# Patient Record
Sex: Male | Born: 1990 | Race: Black or African American | Hispanic: No | Marital: Single | Smoking: Former smoker
Health system: Southern US, Community
[De-identification: ages and names within clinical notes are randomized; demographics above are authoritative.]

## PROBLEM LIST (undated history)

## (undated) DIAGNOSIS — B20 Human immunodeficiency virus [HIV] disease: Secondary | ICD-10-CM

## (undated) DIAGNOSIS — Z21 Asymptomatic human immunodeficiency virus [HIV] infection status: Secondary | ICD-10-CM

## (undated) HISTORY — PX: FINGER SURGERY: SHX640

---
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
## 2011-01-12 ENCOUNTER — Inpatient Hospital Stay (INDEPENDENT_AMBULATORY_CARE_PROVIDER_SITE_OTHER)
Admission: RE | Admit: 2011-01-12 | Discharge: 2011-01-12 | Disposition: A | Discharge status: Home / Self Care | Payer: Medicaid Other | Source: Ambulatory Visit | Attending: Emergency Medicine | Admitting: Emergency Medicine

## 2011-01-12 DIAGNOSIS — R070 Pain in throat: Secondary | ICD-10-CM

## 2011-01-12 LAB — POCT RAPID STREP A (OFFICE): Streptococcus, Group A Screen (Direct): NEGATIVE

## 2011-03-16 LAB — GC/CHLAMYDIA PROBE AMP, GENITAL
Chlamydia, DNA Probe: NEGATIVE
GC Probe Amp, Genital: POSITIVE — AB

## 2012-06-04 ENCOUNTER — Encounter (HOSPITAL_COMMUNITY): Payer: Self-pay

## 2012-06-04 ENCOUNTER — Emergency Department (INDEPENDENT_AMBULATORY_CARE_PROVIDER_SITE_OTHER)
Admission: EM | Admit: 2012-06-04 | Discharge: 2012-06-04 | Disposition: A | Discharge status: Home / Self Care | Payer: Medicaid Other | Source: Home / Self Care | Attending: Emergency Medicine | Admitting: Emergency Medicine

## 2012-06-04 DIAGNOSIS — B86 Scabies: Secondary | ICD-10-CM

## 2012-06-04 MED ORDER — PERMETHRIN 5 % EX CREA: TOPICAL_CREAM | CUTANEOUS | Status: AC

## 2012-06-04 NOTE — Discharge Instructions (Signed)
Scabies is a rash caused by infestation with the mite Sarcoptes scabiei, a 0.3 mm mite that can burrow and deposit eggs in the the surface layer of the skin.  It is transmitted from other infected persons and often the entire family is infested even though they may not have symptoms.  The most common symptom is a very itchy rash.  Scabies can be treated by applying a cream with a medication call Permethrin.  An oral medication called Ivermectin is also available.  Here are the the instructions for treatment of scabies.  It is important to remember that all household members should be treated twice.  Once now, and once in 2 weeks.   On the first night, shower, then apply the Elimite cream from head to toe.  This means on the scalp, face, armpits, hands and wrists, under the breasts, the naval, the genital area, the cleft between the buttocks, the feet and between the toes--everywhere on the body.  Do not miss even 1 square inch.  Leave the cream on overnight, at least 8 hours.  The next morning, shower again and scrub the Elimite off completely.  Clip the nails short and scrub under the nails with a toothbrush, since the mites can often live under the nails, then be transmitted to other parts of the body.   After that, strip the bed and wash sheets, pillow cases, pajamas, underwear and anything that has come in contact with your body in the past month in hot water.    Spray your matresses, chairs, couch, and furniture with RID spray which can be gotten over the counter at the drug store.  Repeat this entire process in 1 week.  After the 2 applications, all the mites on your body should be dead.  It will take a while for the itching to go away, sometimes as much as a month.  The skin must rid itself of all the dead mites, eggs, and excreta.  You can use antihistamines such as Benadryl until this happens.  If the itching is severe, cortisone derivatives may help.  If the itching persists, it may be  that the rash is caused by something else, that your were reinfected or the Permethrin did not work in which case it would be reasonable to try the oral pill Ivermectin.  If you still have a rash or itching in 1 month, return to the Urgent Care Center or your primary care doctor for a recheck.   Scabies Scabies are small bugs (mites) that burrow under the skin and cause red bumps and severe itching. These bugs can only be seen with a microscope. Scabies are highly contagious. They can spread easily from person to person by direct contact. They are also spread through sharing clothing or linens that have the scabies mites living in them. It is not unusual for an entire family to become infected through shared towels, clothing, or bedding.  HOME CARE INSTRUCTIONS   Your caregiver may prescribe a cream or lotion to kill the mites. If this cream is prescribed; massage the cream into the entire area of the body from the neck to the bottom of both feet. Also massage the cream into the scalp and face if your child is less than 20 year old. Avoid the eyes and mouth.   Leave the cream on for 8 to12 hours. Do not wash your hands after application. Your child should bathe or shower after the 8 to 12 hour application period. Sometimes it is  helpful to apply the cream to your child at right before bedtime.   One treatment is usually effective and will eliminate approximately 95% of infestations. For severe cases, your caregiver may decide to repeat the treatment in 1 week. Everyone in your household should be treated with one application of the cream.   New rashes or burrows should not appear after successful treatment within 24 to 48 hours; however the itching and rash may last for 2 to 4 weeks after successful treatment. If your symptoms persist longer than this, see your caregiver.   Your caregiver also may prescribe a medication to help with the itching or to help the rash go away more quickly.   Scabies can  live on clothing or linens for up to 3 days. Your entire child's recently used clothing, towels, stuffed toys, and bed linens should be washed in hot water and then dried in a dryer for at least 20 minutes on high heat. Items that cannot be washed should be enclosed in a plastic bag for at least 3 days.   To help relieve itching, bathe your child in a cool bath or apply cool washcloths to the affected areas.   Your child may return to school after treatment with the prescribed cream.  SEEK MEDICAL CARE IF:   The itching persists longer than 4 weeks after treatment.   The rash spreads or becomes infected (the area has red blisters or yellow-tan crust).  Document Released: 11/22/2005 Document Revised: 11/11/2011 Document Reviewed: 04/02/2009 [facility] [address] Patient Information 2012 [place], [place].

## 2012-06-04 NOTE — ED Provider Notes (Signed)
Chief Complaint  Patient presents with  . Rash    History of Present Illness:   The patient has a two-week history of pruritic papules on his arms, fingers, wrists, and feet. He has no rash anywhere else. No one else he knows or lives with his had a similar rash. He denies any contactants, new foods, or new medications. He has no respiratory problems.  Review of Systems:  Other than noted above, the patient denies any of the following symptoms: Systemic:  No fever, chills, sweats, weight loss, or fatigue. ENT:  No nasal congestion, rhinorrhea, sore throat, swelling of lips, tongue or throat. Resp:  No cough, wheezing, or shortness of breath. Skin:  No rash, itching, nodules, or suspicious lesions.  PMFSH:  Past medical history, family history, social history, meds, and allergies were reviewed.  Physical Exam:   Vital signs:  BP 130/80  Pulse 86  Temp 99.7 F (37.6 C) (Oral)  Resp 17  SpO2 98% Gen:  Alert, oriented, in no distress. ENT:  Pharynx clear, no intraoral lesions, moist mucous membranes. Lungs:  Clear to auscultation. Skin:  He has tiny papules on his hands, fingers, web spaces between fingers, wrists, forearms, ankles, and feet. Skin was otherwise clear.    Assessment:  The encounter diagnosis was Scabies.  Plan:   1.  The following meds were prescribed:   New Prescriptions   PERMETHRIN (ELIMITE) 5 % CREAM    Apply head to toe at bedtime, including skin fold areas, hands, feet and between fingers and toes.  Leave on for at least 8 hours.  Scrub off next morning.  Repeat this same procedure in 1 week.   2.  The patient was instructed in symptomatic care and handouts were given. 3.  The patient was told to return if becoming worse in any way, if no better in 3 or 4 days, and given some red flag symptoms that would indicate earlier return.     Reuben Likes, MD 06/04/12 2030

## 2012-06-04 NOTE — ED Notes (Signed)
Pt has itchy rash on fingers and arms for two weeks.

## 2013-02-22 ENCOUNTER — Telehealth: Payer: Self-pay

## 2013-02-22 NOTE — Telephone Encounter (Signed)
Message left on voicemail regarding referral from [facility] .    Laurell Josephs, RN

## 2013-03-15 ENCOUNTER — Other Ambulatory Visit: Payer: Self-pay | Admitting: Internal Medicine

## 2013-03-15 ENCOUNTER — Ambulatory Visit: Payer: Self-pay

## 2013-03-15 ENCOUNTER — Ambulatory Visit: Payer: Medicaid Other

## 2013-03-15 DIAGNOSIS — B20 Human immunodeficiency virus [HIV] disease: Secondary | ICD-10-CM

## 2013-03-15 LAB — CBC WITH DIFFERENTIAL/PLATELET
Basophils Absolute: 0 10*3/uL (ref 0.0–0.1)
Eosinophils Relative: 2 % (ref 0–5)
Hemoglobin: 16.4 g/dL (ref 13.0–17.0)
Lymphocytes Relative: 51 % — ABNORMAL HIGH (ref 12–46)
Lymphs Abs: 1 10*3/uL (ref 0.7–4.0)
Neutrophils Relative %: 27 % — ABNORMAL LOW (ref 43–77)

## 2013-03-15 LAB — COMPLETE METABOLIC PANEL WITH GFR
AST: 21 U/L (ref 0–37)
Albumin: 4.5 g/dL (ref 3.5–5.2)
Alkaline Phosphatase: 64 U/L (ref 39–117)
GFR, Est African American: >89 mL/min
GFR, Est Non African American: >89 mL/min
Glucose, Bld: 79 mg/dL (ref 70–99)
Sodium: 137 mEq/L (ref 135–145)
Total Protein: 7.6 g/dL (ref 6.0–8.3)

## 2013-03-15 LAB — HEPATITIS B SURFACE ANTIGEN: Hepatitis B Surface Ag: NEGATIVE

## 2013-03-15 LAB — LIPID PANEL
Cholesterol: 175 mg/dL (ref 0–200)
HDL: 35 mg/dL — ABNORMAL LOW (ref >39)
Total CHOL/HDL Ratio: 5 Ratio

## 2013-03-15 LAB — HEPATITIS C ANTIBODY: HCV Ab: NEGATIVE

## 2013-03-16 ENCOUNTER — Telehealth: Payer: Self-pay | Admitting: *Deleted

## 2013-03-16 ENCOUNTER — Ambulatory Visit (INDEPENDENT_AMBULATORY_CARE_PROVIDER_SITE_OTHER): Payer: Self-pay | Admitting: *Deleted

## 2013-03-16 DIAGNOSIS — A539 Syphilis, unspecified: Secondary | ICD-10-CM

## 2013-03-16 LAB — T-HELPER CELL (CD4) - (RCID CLINIC ONLY): CD4 % Helper T Cell: 28 % — ABNORMAL LOW (ref 33–55)

## 2013-03-16 LAB — URINALYSIS
Glucose, UA: NEGATIVE mg/dL
Hgb urine dipstick: NEGATIVE
Ketones, ur: NEGATIVE mg/dL
Nitrite: NEGATIVE
Specific Gravity, Urine: 1.023 (ref 1.005–1.030)
Urobilinogen, UA: 1 mg/dL (ref 0.0–1.0)
pH: 7 (ref 5.0–8.0)

## 2013-03-16 LAB — T.PALLIDUM AB, TOTAL: T pallidum Antibodies (TP-PA): >8 S/CO — ABNORMAL HIGH (ref <0.90)

## 2013-03-16 LAB — HEPATITIS B SURFACE ANTIBODY,QUALITATIVE: Hep B S Ab: REACTIVE — AB

## 2013-03-16 LAB — HEPATITIS B CORE ANTIBODY, TOTAL: Hep B Core Total Ab: NEGATIVE

## 2013-03-16 MED ORDER — PENICILLIN G BENZATHINE 1200000 UNIT/2ML IM SUSP
1.2000 10*6.[IU] | Freq: Once | INTRAMUSCULAR | Status: AC
  Administered 2013-03-16: 1.2 10*6.[IU] via INTRAMUSCULAR

## 2013-03-16 NOTE — Telephone Encounter (Signed)
Patient notified, he will come in this afternoon for 1rst injection. Patrick Glass

## 2013-03-16 NOTE — Telephone Encounter (Signed)
Message copied by Macy Mis on Fri Mar 16, 2013 11:05 AM ------      Message from: Gardiner Barefoot      Created: Fri Mar 16, 2013  9:10 AM       This patient will need syphilis treatment with 3 weekly penicillin injections.  thanks ------

## 2013-03-17 LAB — HIV-1 RNA ULTRAQUANT REFLEX TO GENTYP+: HIV 1 RNA Quant: 63103 copies/mL — ABNORMAL HIGH (ref <20)

## 2013-03-19 NOTE — Progress Notes (Signed)
[facility] I will contact [facility] for records. Pt tested for HIV after finding his partner of 1 years medications.  He stated they practiced safe sex but condom broke on two occasions.   His partner is taking Stribild.   Laurell Josephs, RN

## 2013-03-22 ENCOUNTER — Ambulatory Visit (INDEPENDENT_AMBULATORY_CARE_PROVIDER_SITE_OTHER): Payer: Self-pay | Admitting: *Deleted

## 2013-03-22 ENCOUNTER — Ambulatory Visit: Payer: Self-pay

## 2013-03-22 DIAGNOSIS — A539 Syphilis, unspecified: Secondary | ICD-10-CM

## 2013-03-22 MED ORDER — PENICILLIN G BENZATHINE 1200000 UNIT/2ML IM SUSP
1.2000 10*6.[IU] | Freq: Once | INTRAMUSCULAR | Status: AC
  Administered 2013-03-22: 1.2 10*6.[IU] via INTRAMUSCULAR

## 2013-03-26 ENCOUNTER — Telehealth: Payer: Self-pay

## 2013-03-26 DIAGNOSIS — Z23 Encounter for immunization: Secondary | ICD-10-CM

## 2013-03-26 NOTE — Telephone Encounter (Signed)
Vaccines Updated  Patient will need TB skin test and pneumonia vaccine.

## 2013-03-29 ENCOUNTER — Ambulatory Visit (INDEPENDENT_AMBULATORY_CARE_PROVIDER_SITE_OTHER): Payer: Self-pay | Admitting: *Deleted

## 2013-03-29 DIAGNOSIS — A539 Syphilis, unspecified: Secondary | ICD-10-CM

## 2013-03-29 MED ORDER — PENICILLIN G BENZATHINE 1200000 UNIT/2ML IM SUSP
1.2000 10*6.[IU] | Freq: Once | INTRAMUSCULAR | Status: AC
Start: 2013-03-29 — End: 2013-03-29
  Administered 2013-03-29: 1.2 10*6.[IU] via INTRAMUSCULAR

## 2013-03-29 MED ORDER — PENICILLIN G BENZATHINE 1200000 UNIT/2ML IM SUSP
1.2000 10*6.[IU] | Freq: Once | INTRAMUSCULAR | Status: AC
  Administered 2013-03-29: 1.2 10*6.[IU] via INTRAMUSCULAR

## 2013-03-29 NOTE — Progress Notes (Signed)
Patient in for 3/3 bicillin treatments. Pt tolerated injections well.  Given condoms, educated about safe contact. Andree Coss, RN

## 2013-04-03 ENCOUNTER — Encounter: Payer: Self-pay | Admitting: Internal Medicine

## 2013-04-03 ENCOUNTER — Ambulatory Visit (INDEPENDENT_AMBULATORY_CARE_PROVIDER_SITE_OTHER): Payer: Medicaid Other | Admitting: Internal Medicine

## 2013-04-03 VITALS — BP 143/91 | HR 62 | Temp 98.3°F | Ht 67.0 in | Wt 125.0 lb

## 2013-04-03 DIAGNOSIS — B20 Human immunodeficiency virus [HIV] disease: Secondary | ICD-10-CM | POA: Insufficient documentation

## 2013-04-03 DIAGNOSIS — Z23 Encounter for immunization: Secondary | ICD-10-CM

## 2013-04-03 MED ORDER — ELVITEG-COBIC-EMTRICIT-TENOFDF 150-150-200-300 MG PO TABS: 1.0000 | ORAL_TABLET | Freq: Every day | ORAL | Status: DC

## 2013-04-03 NOTE — Progress Notes (Signed)
  Subjective:    Patient ID: Patrick Glass, male    DOB: 1991-02-07, 22 y.o.   MRN: [id number]  HPI He comes in as a new patient for evaluation of HIV. 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 Unknown to him, he was with an HIV positive partner who was taking Stribild. He used condoms however there were episodes where the condom would break. He does not recall any acute infection. He has no other medical problems. He was treated for syphilis by the [facility] department. No history of gonorrhea or chlamydia. He does have a history of genital herpes.  He does not have any questions regarding HIV. He does feel he is ready to take medications daily.   Review of Systems  Constitutional: Negative for fever, chills, activity change, fatigue and unexpected weight change.  HENT: Negative for sore throat and trouble swallowing.   Respiratory: Negative for cough and shortness of breath.   Cardiovascular: Negative for chest pain, palpitations and leg swelling.  Gastrointestinal: Negative for nausea, abdominal pain and diarrhea.  Musculoskeletal: Negative for myalgias, joint swelling and arthralgias.  Skin: Negative for rash.  Neurological: Negative for dizziness, light-headedness and headaches.       Objective:   Physical Exam  Constitutional: He appears well-developed and well-nourished. No distress.  HENT:  Mouth/Throat: Oropharynx is clear and moist. No oropharyngeal exudate.  Eyes: Right eye exhibits no discharge. Left eye exhibits no discharge. No scleral icterus.  Cardiovascular: Normal rate, regular rhythm and normal heart sounds.  Exam reveals no gallop and no friction rub.   No murmur heard. Pulmonary/Chest: Effort normal and breath sounds normal. No respiratory distress. He has no wheezes. He has no rales.  Abdominal: Soft. Bowel sounds are normal. He exhibits no distension. There is no tenderness.  There is no rebound.  Genitourinary: Penis normal.  Lymphadenopathy:    He has no cervical adenopathy.  Skin: Skin is warm and dry. No rash noted.          Assessment & Plan:

## 2013-04-03 NOTE — Progress Notes (Signed)
HPI: Patrick Glass is a 22 y.o. male presenting with new HIV diagnosis. He states he is ready to start treatment and appears to be a good candidate at this time.  Allergies: No Known Allergies  Vitals: Temp: 98.3 F (36.8 C) (04/29 1356) Temp src: Oral (04/29 1356) BP: 143/91 mmHg (04/29 1356) Pulse Rate: 62 (04/29 1356)  Past Medical History: No past medical history on file.  Social History: History   Social History  . Marital Status: Single    Spouse Name: N/A    Number of Children: N/A  . Years of Education: N/A   Social History Main Topics  . Smoking status: Never Smoker   . Smokeless tobacco: Never Used  . Alcohol Use: 80.0 oz/week    160 drink(s) per week     Comment: vodka  . Drug Use: 1.00 per week    Special: Marijuana  . Sexually Active: Yes -- Male partner(s)    Birth Control/ Protection: Condom     Comment: pt. declined condoms   Other Topics Concern  . None   Social History Narrative  . None   Current Regimen: Treatment naive  Labs: HIV 1 RNA Quant (copies/mL)  Date Value  03/15/2013 63103*     CD4 T Cell Abs (cmm)  Date Value  03/15/2013 320*     Hep B S Ab (no units)  Date Value  03/15/2013 REACTIVE*     Hepatitis B Surface Ag (no units)  Date Value  03/15/2013 NEGATIVE      HCV Ab (no units)  Date Value  03/15/2013 NEGATIVE     CrCl: Estimated Creatinine Clearance: 115.2 ml/min (by C-G formula based on Cr of 0.82).  Lipids:    Component Value Date/Time   CHOL 175 03/15/2013 1125   TRIG 108 03/15/2013 1125   HDL 35* 03/15/2013 1125   CHOLHDL 5.0 03/15/2013 1125   VLDL 22 03/15/2013 1125   LDLCALC 118* 03/15/2013 1125    Assessment: Patient appears likely to have good adherence. He does have some resistance at baseline to nevirapine and efavirenz. We discussed Stribild and importance of adherence to prevent resistance.   Recommendations: Start Stribild once daily  Drue Stager, PharmD [facility] for Infectious  D[facility] 04/03/2013, 3:20 PM

## 2013-04-03 NOTE — Assessment & Plan Note (Signed)
He was counseled on HIV. Appropriate vaccines offered and given. Counseling and medication options given by myself and the pharmacist. He will start Stribild. Occasion prescribed and will go through drug assistance program. I will have him return in about 3 weeks to continue counseling and make sure he is able to get his medications. He was told he can start his medications as soon as he gets it. He does feel he will take it daily and mechanism of resistance.

## 2013-04-18 ENCOUNTER — Other Ambulatory Visit: Payer: Self-pay | Admitting: *Deleted

## 2013-04-18 ENCOUNTER — Telehealth: Payer: Self-pay

## 2013-04-18 DIAGNOSIS — B20 Human immunodeficiency virus [HIV] disease: Secondary | ICD-10-CM

## 2013-04-18 MED ORDER — ELVITEG-COBIC-EMTRICIT-TENOFDF 150-150-200-300 MG PO TABS: 1.0000 | ORAL_TABLET | Freq: Every day | ORAL | Status: DC

## 2013-04-18 NOTE — Telephone Encounter (Signed)
Called to let patient know ADAP was approved and mailbox was full, could not leave a message

## 2013-04-19 ENCOUNTER — Ambulatory Visit (INDEPENDENT_AMBULATORY_CARE_PROVIDER_SITE_OTHER): Payer: Self-pay | Admitting: Internal Medicine

## 2013-04-19 ENCOUNTER — Encounter: Payer: Self-pay | Admitting: Internal Medicine

## 2013-04-19 VITALS — BP 126/83 | HR 64 | Temp 98.0°F | Ht 67.0 in | Wt 126.0 lb

## 2013-04-19 DIAGNOSIS — B20 Human immunodeficiency virus [HIV] disease: Secondary | ICD-10-CM

## 2013-04-19 NOTE — Assessment & Plan Note (Signed)
Going to start his medicine today. He will return in 4 weeks for labs and I will see him 2 weeks after his labs 2 over the results. The patient knows to call if he has any problems with his medications or side effects and not to stop without discussing with [place]. He knows he may have some fatigue as he starts his medicine.

## 2013-04-19 NOTE — Progress Notes (Signed)
  Subjective:    Patient ID: Patrick Glass, male    DOB: 16-Oct-1991, 21 y.o.   MRN: [id number]  HPI Comes in for followup of HIV. He was seen 2 weeks ago as a new patient after [facility] His CD4 count is 320 and his viral load is detectable with baseline mutation of 103 and and 98.  He completed his drug assistance paperwork and his prescription was sent to the pharmacy yesterday. He has no new questions today. He is going to go to the pharmacy today and pick up his medicines.   Review of Systems  Constitutional: Negative for fever, fatigue and unexpected weight change.  HENT: Negative for sore throat and trouble swallowing.   Gastrointestinal: Negative for nausea, abdominal pain and diarrhea.  Skin: Negative for rash.  Psychiatric/Behavioral: Negative for dysphoric mood. The patient is not nervous/anxious.        Objective:   Physical Exam  Constitutional: He appears well-developed and well-nourished. No distress.  Cardiovascular: Normal rate, regular rhythm and normal heart sounds.   No murmur heard.         Assessment & Plan:

## 2013-05-17 ENCOUNTER — Other Ambulatory Visit: Payer: Self-pay

## 2013-05-17 DIAGNOSIS — B20 Human immunodeficiency virus [HIV] disease: Secondary | ICD-10-CM

## 2013-05-17 LAB — COMPLETE METABOLIC PANEL WITH GFR
Albumin: 4.5 g/dL (ref 3.5–5.2)
Alkaline Phosphatase: 66 U/L (ref 39–117)
Calcium: 9.8 mg/dL (ref 8.4–10.5)
GFR, Est Non African American: 89 mL/min
Glucose, Bld: 85 mg/dL (ref 70–99)
Sodium: 138 mEq/L (ref 135–145)
Total Bilirubin: 0.5 mg/dL (ref 0.3–1.2)

## 2013-05-18 LAB — CBC WITH DIFFERENTIAL/PLATELET
Basophils Absolute: 0 10*3/uL (ref 0.0–0.1)
Basophils Relative: 0 % (ref 0–1)
Eosinophils Absolute: 0.1 10*3/uL (ref 0.0–0.7)
Eosinophils Relative: 5 % (ref 0–5)
HCT: 43.5 % (ref 39.0–52.0)
Lymphocytes Relative: 55 % — ABNORMAL HIGH (ref 12–46)
MCH: 33.8 pg (ref 26.0–34.0)
MCHC: 36.1 g/dL — ABNORMAL HIGH (ref 30.0–36.0)
MCV: 93.8 fL (ref 78.0–100.0)
Monocytes Absolute: 0.3 10*3/uL (ref 0.1–1.0)
Neutro Abs: 0.7 10*3/uL — ABNORMAL LOW (ref 1.7–7.7)
Neutrophils Relative %: 29 % — ABNORMAL LOW (ref 43–77)
Platelets: 211 10*3/uL (ref 150–400)
WBC: 2.4 10*3/uL — ABNORMAL LOW (ref 4.0–10.5)

## 2013-05-18 LAB — HIV-1 RNA QUANT-NO REFLEX-BLD: HIV-1 RNA Quant, Log: <1.3 {Log} (ref <1.30)

## 2013-05-31 ENCOUNTER — Encounter: Payer: Self-pay | Admitting: Internal Medicine

## 2013-05-31 ENCOUNTER — Ambulatory Visit (INDEPENDENT_AMBULATORY_CARE_PROVIDER_SITE_OTHER): Payer: Self-pay | Admitting: Internal Medicine

## 2013-05-31 VITALS — BP 114/75 | HR 75 | Temp 98.0°F | Ht 67.0 in | Wt 129.0 lb

## 2013-05-31 DIAGNOSIS — B20 Human immunodeficiency virus [HIV] disease: Secondary | ICD-10-CM

## 2013-05-31 NOTE — Assessment & Plan Note (Signed)
He is doing well with his regimen and will continue. He can come in 3 months. He was reminded to use condoms and call if he has any problems prior to his next appointment

## 2013-05-31 NOTE — Progress Notes (Signed)
  Subjective:    Patient ID: Nizar Q [place], male    DOB: Aug 18, 1991, 22 y.o.   MRN: [id number]  HPI E. He was started on Stribild and have his list last visit started and has been taking it daily.  He denies any side effects, no fatigue or any other issues and is pleased with regimen. His virus is now undetectable and CD4 count is up to 480. No new issues or concerns.   Review of Systems  Constitutional: Negative for fever, fatigue and unexpected weight change.  HENT: Negative for sore throat and trouble swallowing.   Gastrointestinal: Negative for nausea and diarrhea.  Skin: Negative for rash.  Neurological: Negative for dizziness, light-headedness and headaches.  Hematological: Negative for adenopathy.  Psychiatric/Behavioral: Negative for dysphoric mood.       Objective:   Physical Exam  Constitutional: He appears well-developed and well-nourished. No distress.  HENT:  Mouth/Throat: No oropharyngeal exudate.  Cardiovascular: Normal rate, regular rhythm and normal heart sounds.   No murmur heard. Pulmonary/Chest: Effort normal and breath sounds normal. No respiratory distress.  Lymphadenopathy:    He has no cervical adenopathy.          Assessment & Plan:

## 2013-09-11 ENCOUNTER — Other Ambulatory Visit: Payer: Self-pay

## 2013-09-13 ENCOUNTER — Ambulatory Visit: Payer: Self-pay | Admitting: Internal Medicine

## 2013-09-19 ENCOUNTER — Other Ambulatory Visit: Payer: Self-pay | Admitting: Licensed Clinical Social Worker

## 2013-09-19 ENCOUNTER — Other Ambulatory Visit (INDEPENDENT_AMBULATORY_CARE_PROVIDER_SITE_OTHER): Payer: Self-pay

## 2013-09-19 DIAGNOSIS — B20 Human immunodeficiency virus [HIV] disease: Secondary | ICD-10-CM

## 2013-09-19 LAB — COMPLETE METABOLIC PANEL WITH GFR
Albumin: 4.7 g/dL (ref 3.5–5.2)
Alkaline Phosphatase: 68 U/L (ref 39–117)
BUN: 14 mg/dL (ref 6–23)
CO2: 28 mEq/L (ref 19–32)
GFR, Est African American: >89 mL/min
GFR, Est Non African American: >89 mL/min
Glucose, Bld: 87 mg/dL (ref 70–99)
Potassium: 4.3 mEq/L (ref 3.5–5.3)
Sodium: 138 mEq/L (ref 135–145)
Total Bilirubin: 0.3 mg/dL (ref 0.3–1.2)
Total Protein: 7.6 g/dL (ref 6.0–8.3)

## 2013-09-19 MED ORDER — ELVITEG-COBIC-EMTRICIT-TENOFDF 150-150-200-300 MG PO TABS: 1.0000 | ORAL_TABLET | Freq: Every day | ORAL | Status: DC

## 2013-09-20 LAB — CBC WITH DIFFERENTIAL/PLATELET
Basophils Absolute: 0 10*3/uL (ref 0.0–0.1)
Basophils Relative: 0 % (ref 0–1)
HCT: 43 % (ref 39.0–52.0)
Lymphocytes Relative: 53 % — ABNORMAL HIGH (ref 12–46)
MCHC: 35.8 g/dL (ref 30.0–36.0)
Neutro Abs: 1 10*3/uL — ABNORMAL LOW (ref 1.7–7.7)
Neutrophils Relative %: 33 % — ABNORMAL LOW (ref 43–77)
RDW: 12.6 % (ref 11.5–15.5)
WBC: 3 10*3/uL — ABNORMAL LOW (ref 4.0–10.5)

## 2013-09-20 LAB — T-HELPER CELL (CD4) - (RCID CLINIC ONLY)
CD4 % Helper T Cell: 37 % (ref 33–55)
CD4 T Cell Abs: 620 /uL (ref 400–2700)

## 2013-09-27 ENCOUNTER — Encounter: Payer: Self-pay | Admitting: Internal Medicine

## 2013-09-27 ENCOUNTER — Ambulatory Visit (INDEPENDENT_AMBULATORY_CARE_PROVIDER_SITE_OTHER): Payer: Self-pay | Admitting: Internal Medicine

## 2013-09-27 VITALS — BP 131/88 | HR 57 | Temp 98.4°F | Ht 67.0 in | Wt 122.0 lb

## 2013-09-27 DIAGNOSIS — B20 Human immunodeficiency virus [HIV] disease: Secondary | ICD-10-CM

## 2013-09-27 DIAGNOSIS — Z0000 Encounter for general adult medical examination without abnormal findings: Secondary | ICD-10-CM | POA: Insufficient documentation

## 2013-09-27 DIAGNOSIS — Z23 Encounter for immunization: Secondary | ICD-10-CM

## 2013-09-27 NOTE — Progress Notes (Signed)
  Subjective:    Patient ID: Patrick Glass, male    DOB: December 08, 1990, 22 y.o.   MRN: [id number]  HPI  He is here for routine followup.Marland Kitchen He was started on Stribild in April and has been taking it daily.  He denies any side effects, no fatigue or any other issues and is pleased with regimen. His virus is now undetectable and CD4 count is up to 620. No new issues or concerns.  His lipids are good, RPR and urine cytology negative.   Review of Systems  Constitutional: Negative for fever, fatigue and unexpected weight change.  HENT: Negative for sore throat and trouble swallowing.   Gastrointestinal: Negative for nausea and diarrhea.  Skin: Negative for rash.  Neurological: Negative for dizziness, light-headedness and headaches.  Hematological: Negative for adenopathy.  Psychiatric/Behavioral: Negative for dysphoric mood.       Objective:   Physical Exam  Constitutional: He appears well-developed and well-nourished. No distress.  HENT:  Mouth/Throat: No oropharyngeal exudate.  Cardiovascular: Normal rate, regular rhythm and normal heart sounds.   No murmur heard. Pulmonary/Chest: Effort normal and breath sounds normal. No respiratory distress.  Lymphadenopathy:    He has no cervical adenopathy.          Assessment & Plan:

## 2013-09-27 NOTE — Assessment & Plan Note (Signed)
Cholesterol is good no issues with liver kidneys. This will be checked yearly

## 2013-09-27 NOTE — Assessment & Plan Note (Signed)
He is doing well and no new issues. He will return to clinic in 4 months.

## 2013-10-22 ENCOUNTER — Other Ambulatory Visit: Payer: Self-pay | Admitting: *Deleted

## 2013-10-22 DIAGNOSIS — B20 Human immunodeficiency virus [HIV] disease: Secondary | ICD-10-CM

## 2013-10-22 MED ORDER — ELVITEG-COBIC-EMTRICIT-TENOFDF 150-150-200-300 MG PO TABS: 1.0000 | ORAL_TABLET | Freq: Every day | ORAL | Status: DC

## 2014-01-28 ENCOUNTER — Other Ambulatory Visit: Payer: Self-pay

## 2014-02-07 ENCOUNTER — Other Ambulatory Visit (HOSPITAL_COMMUNITY)
Admission: RE | Admit: 2014-02-07 | Discharge: 2014-02-07 | Disposition: A | Discharge status: Home / Self Care | Payer: Self-pay | Source: Ambulatory Visit | Attending: Emergency Medicine | Admitting: Emergency Medicine

## 2014-02-07 ENCOUNTER — Emergency Department (INDEPENDENT_AMBULATORY_CARE_PROVIDER_SITE_OTHER)
Admission: EM | Admit: 2014-02-07 | Discharge: 2014-02-07 | Disposition: A | Discharge status: Home / Self Care | Payer: Self-pay | Source: Home / Self Care | Attending: Emergency Medicine | Admitting: Emergency Medicine

## 2014-02-07 ENCOUNTER — Encounter (HOSPITAL_COMMUNITY): Payer: Self-pay | Admitting: Emergency Medicine

## 2014-02-07 DIAGNOSIS — Z113 Encounter for screening for infections with a predominantly sexual mode of transmission: Secondary | ICD-10-CM | POA: Insufficient documentation

## 2014-02-07 DIAGNOSIS — N342 Other urethritis: Secondary | ICD-10-CM

## 2014-02-07 HISTORY — DX: Human immunodeficiency virus (HIV) disease: B20

## 2014-02-07 HISTORY — DX: Asymptomatic human immunodeficiency virus (hiv) infection status: Z21

## 2014-02-07 MED ORDER — AZITHROMYCIN 250 MG PO TABS
1000.0000 mg | ORAL_TABLET | Freq: Once | ORAL | Status: AC
  Administered 2014-02-07: 1000 mg via ORAL

## 2014-02-07 MED ORDER — AZITHROMYCIN 250 MG PO TABS
ORAL_TABLET | ORAL | Status: AC
  Filled 2014-02-07: qty 4

## 2014-02-07 MED ORDER — CEFTRIAXONE SODIUM 250 MG IJ SOLR
INTRAMUSCULAR | Status: AC
  Filled 2014-02-07: qty 250

## 2014-02-07 MED ORDER — LIDOCAINE HCL (PF) 1 % IJ SOLN
INTRAMUSCULAR | Status: AC
  Filled 2014-02-07: qty 5

## 2014-02-07 MED ORDER — CEFTRIAXONE SODIUM 250 MG IJ SOLR
250.0000 mg | Freq: Once | INTRAMUSCULAR | Status: AC
  Administered 2014-02-07: 250 mg via INTRAMUSCULAR

## 2014-02-07 NOTE — ED Provider Notes (Signed)
  Chief Complaint   Chief Complaint  Patient presents with  . Exposure to STD    History of Present Illness   Patrick Glass is a 23 year old male who has had a two-day history of urethral discharge and dysuria. He denies any penile lesions, fever, chills, sore throat, skin rash, or joint pain. He is HIV positive and is on Stribild. He is being followed by Dr. Luciana Axeomer for his HIV infection. He's on Stribild. Most recent CD4 counts were good and his viral titer was undetectable. He has had a history of gonorrhea in the past.  Review of Systems   Other than as noted above, the patient denies any of the following symptoms: Systemic:  No fevers chills, arthralgias, or adenopathy. GI:  No abdominal pain, nausea or vomiting. GU:  No dysuria, penile pain, discharge, itching, dysuria, genital lesions, testicular pain or swelling. Skin:  No rash or itching.  PMFSH   Past medical history, family history, social history, meds, and allergies were reviewed.   Physical Examination    Vital signs:  BP 125/75  Pulse 82  Temp(Src) 98.5 F (36.9 C) (Oral)  Resp 18  SpO2 100% Gen:  Alert, oriented, in no distress. Abdomen:  Soft and flat, non-distended, and non-tender.  No organomegaly or mass. Genital:  There is a thick, yellow discharge coming from the urethra. No penile lesions. No inguinal adenopathy, testes were normal. Skin:  Warm and dry.  No rash.   Labs   Urine DNA probes obtained for gonorrhea, Chlamydia, Trichomonas.  Course in [facility]gent [facility] Center    Given Rocephin 250 mg IM and azithromycin 1000 mg by mouth.  Assessment   The encounter diagnosis was Urethritis.  Probably gonorrhea.  Plan    1.  Meds:  The following meds were prescribed:   New Prescriptions   No medications on file    2.  Patient Education/Counseling:  The patient was given appropriate handouts, self care instructions, and instructed in symptomatic relief.The patient was instructed to inform all sexual  contacts, avoid intercourse completely for 2 weeks and then only with a condom.  The patient was told that we would call about all abnormal lab results, and that we would need to report certain kinds of infection to the health department.    3.  Follow up:  The patient was told to follow up here if no better in 3 to 4 days, or sooner if becoming worse in any way, and given some red flag symptoms such as fever, pain, or difficulty urinating which would prompt immediate return.       Reuben Likesavid C Helayna Dun, MD 02/07/14 2035

## 2014-02-07 NOTE — Discharge Instructions (Signed)
Gonorrhea Gonorrhea is an infection that can cause serious problems. If left untreated, may   Damage the male or male organs.   Cause women to be unable to have children (sterility).   Harm a fetus, if the infected woman is pregnant.  It is important to get treatment for gonorrhea as soon as possible. It is also necessary that all your sexual partners be tested for the infection.  CAUSES  Gonorrhea is caused by bacteria called Neisseria gonorrhoeae. The infection is spread from person to person, usually by sexual contact (such as by anal, vaginal, or oral means). A newborn can contract the infection from his or her mother during birth.  SYMPTOMS  Some people with gonorrhea do not have symptoms. Symptoms may be different in females and males.  Females The most common symptoms are:   Pain in the lower abdomen.   Fever with or without chills.  Other symptoms include:   Abnormal vaginal discharge.   Painful intercourse.   Burning or itching of the vagina or lips of the vagina.   Abnormal vaginal bleeding.   Pain when urinating.   Long-lasting (chronic) pain in the lower abdomen, especially during menstruation or intercourse.   Inability to become pregnant.   Going into premature labor.   Irritation, pain, bleeding, or discharge from the rectum. This may occur if the infection was spread by anal sex.   Sore throat or swollen neck lymph nodes. This may occur if the infection was spread by oral sex.  Males The most common symptoms are:   Discharge from the penis.   Pain or burning during urination.   Pain or swelling in the testicles. Other symptoms may include:   Irritation, pain, bleeding, or discharge from the rectum. This may occur if the infection was spread by anal sex.   Sore throat, fever, or swollen neck lymph nodes. This may occur if the infection was spread by oral sex.  DIAGNOSIS  A diagnosis is made after a physical exam is done and a  sample of discharge is examined under a microscope for the presence of the bacteria. The discharge may be taken from the urethra, cervix, throat, or rectum.  TREATMENT  Gonorrhea is treated with antibiotic medicines. It is important for treatment to begin as soon as possible. Early treatment may prevent some problems from developing.  HOME CARE INSTRUCTIONS   Only take over-the-counter or prescription medicines for pain, fever, or discomfort as directed by your health care provider.   Take antibiotics as directed. Make sure you finish them even if you start to feel better. Incomplete treatment will put you at risk for continued infection.   Do not have sex until treatment is complete or as directed by your health care provider.   Follow up with your health care provider as directed.   Not all test results are available during your visit. If your test results are not back during the visit, make an appointment with your health care provider to find out the results. Do not assume everything is normal if you have not heard from your health care provider or the medical facility. It is important for you to follow up on all of your test results.   If you test positive for gonorrhea, inform your recent sexual partners. They need to be checked for gonorrhea even if they do not have symptoms. They may need treatment, even if they test negative for gonorrhea.  SEEK MEDICAL CARE IF:   You develop any bad  reaction to the medicine you were prescribed. This may include:   °· A rash.   °· Nausea.   °· Vomiting.   °· Diarrhea.   °· Your symptoms do not improve after a few days of taking antibiotics.   °· Your symptoms get worse.   °· You develop increased pain, such as in the testicles (for males) or in the abdomen (for females).   °SEEK IMMEDIATE MEDICAL CARE IF: °· You have a fever or persistent symptoms for more than 2 3 days.   °· You have a fever and your symptoms suddenly get worse.   °MAKE SURE YOU:    °· Understand these instructions. °· Will watch your condition. °· Will get help right away if you are not doing well or get worse. °Document Released: 11/19/2000 Document Revised: 09/12/2013 Document Reviewed: 05/30/2013 °[employer]® Patient Information ©2014 [employer]. ° °

## 2014-02-07 NOTE — ED Notes (Signed)
Reports penile discharge noticed today with seeing yellow discoloration in boxers .  Patient has had painful urination for 2 days

## 2014-02-08 LAB — URINE CYTOLOGY ANCILLARY ONLY
Chlamydia: NEGATIVE
Neisseria Gonorrhea: NEGATIVE
Trichomonas: NEGATIVE

## 2014-02-08 NOTE — Progress Notes (Signed)
Quick Note:  Test result was normal. No further action is needed at this time. ______ 

## 2014-02-14 ENCOUNTER — Ambulatory Visit: Payer: Self-pay | Admitting: Internal Medicine

## 2014-03-06 ENCOUNTER — Ambulatory Visit: Payer: Self-pay

## 2014-03-06 ENCOUNTER — Other Ambulatory Visit: Payer: Self-pay | Admitting: *Deleted

## 2014-03-06 ENCOUNTER — Ambulatory Visit (INDEPENDENT_AMBULATORY_CARE_PROVIDER_SITE_OTHER): Payer: Self-pay | Admitting: Internal Medicine

## 2014-03-06 ENCOUNTER — Encounter: Payer: Self-pay | Admitting: *Deleted

## 2014-03-06 VITALS — BP 133/78 | HR 71 | Temp 98.3°F | Ht 68.0 in | Wt 127.0 lb

## 2014-03-06 DIAGNOSIS — B20 Human immunodeficiency virus [HIV] disease: Secondary | ICD-10-CM

## 2014-03-06 DIAGNOSIS — Z0000 Encounter for general adult medical examination without abnormal findings: Secondary | ICD-10-CM

## 2014-03-06 LAB — COMPLETE METABOLIC PANEL WITH GFR
ALBUMIN: 4.3 g/dL (ref 3.5–5.2)
ALT: 10 U/L (ref 0–53)
AST: 18 U/L (ref 0–37)
Alkaline Phosphatase: 74 U/L (ref 39–117)
BUN: 11 mg/dL (ref 6–23)
CHLORIDE: 104 meq/L (ref 96–112)
CO2: 29 meq/L (ref 19–32)
Calcium: 9.5 mg/dL (ref 8.4–10.5)
Creat: 1.05 mg/dL (ref 0.50–1.35)
GFR, Est Non African American: >89 mL/min
Glucose, Bld: 78 mg/dL (ref 70–99)
POTASSIUM: 4.2 meq/L (ref 3.5–5.3)
SODIUM: 138 meq/L (ref 135–145)
Total Bilirubin: 0.3 mg/dL (ref 0.2–1.2)
Total Protein: 7.1 g/dL (ref 6.0–8.3)

## 2014-03-06 MED ORDER — ELVITEG-COBIC-EMTRICIT-TENOFDF 150-150-200-300 MG PO TABS: 1.0000 | ORAL_TABLET | Freq: Every day | ORAL | Status: DC

## 2014-03-07 LAB — CBC WITH DIFFERENTIAL/PLATELET
BASOS PCT: 1 % (ref 0–1)
Basophils Absolute: 0 10*3/uL (ref 0.0–0.1)
EOS PCT: 5 % (ref 0–5)
Eosinophils Absolute: 0.2 10*3/uL (ref 0.0–0.7)
HCT: 38.7 % — ABNORMAL LOW (ref 39.0–52.0)
Hemoglobin: 13.7 g/dL (ref 13.0–17.0)
LYMPHS ABS: 1.7 10*3/uL (ref 0.7–4.0)
Lymphocytes Relative: 58 % — ABNORMAL HIGH (ref 12–46)
MCH: 33.5 pg (ref 26.0–34.0)
MCHC: 35.4 g/dL (ref 30.0–36.0)
MCV: 94.6 fL (ref 78.0–100.0)
Monocytes Absolute: 0.3 10*3/uL (ref 0.1–1.0)
Monocytes Relative: 9 % (ref 3–12)
Neutro Abs: 0.8 10*3/uL — ABNORMAL LOW (ref 1.7–7.7)
Neutrophils Relative %: 27 % — ABNORMAL LOW (ref 43–77)
Platelets: 224 10*3/uL (ref 150–400)
RBC: 4.09 MIL/uL — AB (ref 4.22–5.81)
RDW: 12.8 % (ref 11.5–15.5)
WBC: 3 10*3/uL — ABNORMAL LOW (ref 4.0–10.5)

## 2014-03-07 LAB — HIV-1 RNA QUANT-NO REFLEX-BLD
HIV 1 RNA QUANT: 25 {copies}/mL — AB (ref <20)
HIV-1 RNA Quant, Log: 1.4 {Log} — ABNORMAL HIGH (ref <1.30)

## 2014-03-07 NOTE — Assessment & Plan Note (Signed)
Will continue with regimen and get labs today.  RTC in 4 months unless concerns on todays labs.

## 2014-03-07 NOTE — Progress Notes (Signed)
  Subjective:    Patient ID: Patrick Glass, male    DOB: 07/25/1991, 23 y.o.   MRN: [id number]  HPI  He is here for routine followup.Marland Kitchen. He was started on Stribild in April 2014 and has been taking it daily.  He denies any side effects, no fatigue or any other issues and is pleased with regimen. No labs prior to visit.  No new issues or concerns.     Review of Systems  Constitutional: Negative for fever, fatigue and unexpected weight change.  HENT: Negative for sore throat and trouble swallowing.   Gastrointestinal: Negative for nausea and diarrhea.  Skin: Negative for rash.  Neurological: Negative for dizziness, light-headedness and headaches.  Hematological: Negative for adenopathy.  Psychiatric/Behavioral: Negative for dysphoric mood.       Objective:   Physical Exam  Constitutional: He appears well-developed and well-nourished. No distress.  HENT:  Mouth/Throat: No oropharyngeal exudate.  Cardiovascular: Normal rate, regular rhythm and normal heart sounds.   No murmur heard. Pulmonary/Chest: Effort normal and breath sounds normal. No respiratory distress.  Lymphadenopathy:    He has no cervical adenopathy.          Assessment & Plan:

## 2014-03-08 LAB — T-HELPER CELL (CD4) - (RCID CLINIC ONLY)
CD4 % Helper T Cell: 47 % (ref 33–55)
CD4 T Cell Abs: 790 /uL (ref 400–2700)

## 2014-07-09 ENCOUNTER — Encounter: Payer: Self-pay | Admitting: Internal Medicine

## 2014-07-09 ENCOUNTER — Ambulatory Visit (INDEPENDENT_AMBULATORY_CARE_PROVIDER_SITE_OTHER): Payer: Self-pay | Admitting: Internal Medicine

## 2014-07-09 VITALS — BP 119/78 | HR 60 | Temp 98.2°F | Wt 123.0 lb

## 2014-07-09 DIAGNOSIS — Z79899 Other long term (current) drug therapy: Secondary | ICD-10-CM

## 2014-07-09 DIAGNOSIS — B20 Human immunodeficiency virus [HIV] disease: Secondary | ICD-10-CM

## 2014-07-09 DIAGNOSIS — Z113 Encounter for screening for infections with a predominantly sexual mode of transmission: Secondary | ICD-10-CM

## 2014-07-09 LAB — CBC WITH DIFFERENTIAL/PLATELET
BASOS ABS: 0 10*3/uL (ref 0.0–0.1)
BASOS PCT: 0 % (ref 0–1)
EOS ABS: 0.1 10*3/uL (ref 0.0–0.7)
Eosinophils Relative: 3 % (ref 0–5)
HCT: 37.8 % — ABNORMAL LOW (ref 39.0–52.0)
Hemoglobin: 13.2 g/dL (ref 13.0–17.0)
Lymphocytes Relative: 59 % — ABNORMAL HIGH (ref 12–46)
Lymphs Abs: 1.8 10*3/uL (ref 0.7–4.0)
MCH: 33.2 pg (ref 26.0–34.0)
MCHC: 34.9 g/dL (ref 30.0–36.0)
MCV: 95.2 fL (ref 78.0–100.0)
Monocytes Absolute: 0.3 10*3/uL (ref 0.1–1.0)
Monocytes Relative: 11 % (ref 3–12)
NEUTROS ABS: 0.8 10*3/uL — AB (ref 1.7–7.7)
NEUTROS PCT: 27 % — AB (ref 43–77)
PLATELETS: 235 10*3/uL (ref 150–400)
RBC: 3.97 MIL/uL — ABNORMAL LOW (ref 4.22–5.81)
RDW: 13.2 % (ref 11.5–15.5)
WBC: 3.1 10*3/uL — ABNORMAL LOW (ref 4.0–10.5)

## 2014-07-09 LAB — COMPLETE METABOLIC PANEL WITH GFR
ALT: <8 U/L (ref 0–53)
AST: 13 U/L (ref 0–37)
Albumin: 4 g/dL (ref 3.5–5.2)
Alkaline Phosphatase: 61 U/L (ref 39–117)
BUN: 10 mg/dL (ref 6–23)
CALCIUM: 9.1 mg/dL (ref 8.4–10.5)
CHLORIDE: 104 meq/L (ref 96–112)
CO2: 28 meq/L (ref 19–32)
Creat: 0.99 mg/dL (ref 0.50–1.35)
GLUCOSE: 81 mg/dL (ref 70–99)
Potassium: 4.3 mEq/L (ref 3.5–5.3)
SODIUM: 137 meq/L (ref 135–145)
TOTAL PROTEIN: 6.3 g/dL (ref 6.0–8.3)
Total Bilirubin: 0.3 mg/dL (ref 0.2–1.2)

## 2014-07-09 LAB — LIPID PANEL
CHOLESTEROL: 156 mg/dL (ref 0–200)
HDL: 48 mg/dL (ref >39)
LDL Cholesterol: 88 mg/dL (ref 0–99)
Total CHOL/HDL Ratio: 3.3 Ratio
Triglycerides: 101 mg/dL (ref <150)
VLDL: 20 mg/dL (ref 0–40)

## 2014-07-09 NOTE — Assessment & Plan Note (Signed)
Labs today.  RTC 4 months unless concerns  

## 2014-07-09 NOTE — Progress Notes (Signed)
  Subjective:    Patient ID: Keyen Q [place], male    DOB: 1991/01/04, 23 y.o.   MRN: [id number]  HPI  He is here for routine followup.Marland Kitchen. He was started on Stribild in April 2014 and has been taking it daily.  He denies any side effects, no fatigue or any other issues and is pleased with regimen. No labs prior to visit.  No new issues or concerns.     Review of Systems  Constitutional: Negative for fever, fatigue and unexpected weight change.  HENT: Negative for sore throat and trouble swallowing.   Gastrointestinal: Negative for nausea and diarrhea.  Skin: Negative for rash.  Neurological: Negative for dizziness, light-headedness and headaches.  Hematological: Negative for adenopathy.  Psychiatric/Behavioral: Negative for dysphoric mood.       Objective:   Physical Exam  Constitutional: He appears well-developed and well-nourished. No distress.  HENT:  Mouth/Throat: No oropharyngeal exudate.  Cardiovascular: Normal rate, regular rhythm and normal heart sounds.   No murmur heard. Pulmonary/Chest: Effort normal and breath sounds normal. No respiratory distress.  Lymphadenopathy:    He has no cervical adenopathy.          Assessment & Plan:

## 2014-07-10 LAB — RPR TITER

## 2014-07-10 LAB — T-HELPER CELL (CD4) - (RCID CLINIC ONLY)
CD4 % Helper T Cell: 48 % (ref 33–55)
CD4 T Cell Abs: 900 /uL (ref 400–2700)

## 2014-07-10 LAB — RPR: RPR Ser Ql: REACTIVE — AB

## 2014-07-11 LAB — HIV-1 RNA QUANT-NO REFLEX-BLD: HIV 1 RNA Quant: <20 copies/mL (ref <20)

## 2014-07-11 LAB — FLUORESCENT TREPONEMAL AB(FTA)-IGG-BLD: Fluorescent Treponemal ABS: NONREACTIVE

## 2014-07-18 ENCOUNTER — Other Ambulatory Visit: Payer: Self-pay | Admitting: *Deleted

## 2014-07-18 ENCOUNTER — Ambulatory Visit: Payer: Self-pay

## 2014-07-18 DIAGNOSIS — B20 Human immunodeficiency virus [HIV] disease: Secondary | ICD-10-CM

## 2014-07-18 MED ORDER — ELVITEG-COBIC-EMTRICIT-TENOFDF 150-150-200-300 MG PO TABS: 1.0000 | ORAL_TABLET | Freq: Every day | ORAL | Status: DC

## 2014-10-07 ENCOUNTER — Other Ambulatory Visit: Payer: Self-pay | Admitting: Licensed Clinical Social Worker

## 2014-10-07 DIAGNOSIS — B20 Human immunodeficiency virus [HIV] disease: Secondary | ICD-10-CM

## 2014-10-07 MED ORDER — ELVITEG-COBIC-EMTRICIT-TENOFDF 150-150-200-300 MG PO TABS: 1.0000 | ORAL_TABLET | Freq: Every day | ORAL | Status: DC

## 2014-11-07 ENCOUNTER — Ambulatory Visit (INDEPENDENT_AMBULATORY_CARE_PROVIDER_SITE_OTHER): Payer: Self-pay | Admitting: Internal Medicine

## 2014-11-07 ENCOUNTER — Encounter: Payer: Self-pay | Admitting: Internal Medicine

## 2014-11-07 VITALS — BP 123/77 | HR 70 | Temp 98.0°F | Ht 67.0 in | Wt 127.0 lb

## 2014-11-07 DIAGNOSIS — Z23 Encounter for immunization: Secondary | ICD-10-CM

## 2014-11-07 DIAGNOSIS — B20 Human immunodeficiency virus [HIV] disease: Secondary | ICD-10-CM

## 2014-11-07 DIAGNOSIS — Z113 Encounter for screening for infections with a predominantly sexual mode of transmission: Secondary | ICD-10-CM

## 2014-11-07 NOTE — Assessment & Plan Note (Signed)
Doing well, labs today and rtc 4 months unless concerns.  

## 2014-11-07 NOTE — Progress Notes (Signed)
  Subjective:    Patient ID: Patrick Glass, male    DOB: Sep 29, 1991, 23 y.o.   MRN: [id number]  HPI He is here for routine followup.Marland Kitchen. He was started on Stribild in April 2014 and has been taking it daily.  He denies any side effects, no fatigue or any other issues and is pleased with regimen. No labs prior to visit.  No new issues or concerns.     Review of Systems  Constitutional: Negative for fever, fatigue and unexpected weight change.  HENT: Negative for sore throat and trouble swallowing.   Gastrointestinal: Negative for nausea and diarrhea.  Skin: Negative for rash.  Neurological: Negative for dizziness, light-headedness and headaches.  Hematological: Negative for adenopathy.  Psychiatric/Behavioral: Negative for dysphoric mood.       Objective:   Physical Exam  Constitutional: He appears well-developed and well-nourished. No distress.  HENT:  Mouth/Throat: No oropharyngeal exudate.  Eyes: No scleral icterus.  Cardiovascular: Normal rate, regular rhythm and normal heart sounds.   No murmur heard. Pulmonary/Chest: Effort normal and breath sounds normal. No respiratory distress.  Lymphadenopathy:    He has no cervical adenopathy.  Skin: No rash noted.          Assessment & Plan:

## 2014-11-08 LAB — FLUORESCENT TREPONEMAL AB(FTA)-IGG-BLD: Fluorescent Treponemal ABS: REACTIVE — AB

## 2014-11-08 LAB — T-HELPER CELL (CD4) - (RCID CLINIC ONLY)
CD4 % Helper T Cell: 48 % (ref 33–55)
CD4 T CELL ABS: 920 /uL (ref 400–2700)

## 2014-11-08 LAB — RPR TITER

## 2014-11-08 LAB — HIV-1 RNA QUANT-NO REFLEX-BLD: HIV 1 RNA Quant: <20 copies/mL (ref <20)

## 2014-11-08 LAB — RPR: RPR Ser Ql: REACTIVE — AB

## 2015-03-10 ENCOUNTER — Ambulatory Visit: Payer: Self-pay | Admitting: Internal Medicine

## 2015-04-29 ENCOUNTER — Ambulatory Visit (INDEPENDENT_AMBULATORY_CARE_PROVIDER_SITE_OTHER): Payer: Self-pay | Admitting: Internal Medicine

## 2015-04-29 ENCOUNTER — Ambulatory Visit: Payer: Self-pay

## 2015-04-29 ENCOUNTER — Encounter: Payer: Self-pay | Admitting: Internal Medicine

## 2015-04-29 VITALS — BP 121/81 | HR 69 | Temp 97.6°F | Ht 68.0 in | Wt 132.0 lb

## 2015-04-29 DIAGNOSIS — B20 Human immunodeficiency virus [HIV] disease: Secondary | ICD-10-CM

## 2015-04-29 NOTE — Assessment & Plan Note (Signed)
Will get labs today.  He was given an extra 21 days and hopefully will get him until he is renewed to avoid treatment interruption.  RTC 6 weeks to see how he is doing.

## 2015-04-29 NOTE — Progress Notes (Signed)
  Subjective:    Patient ID: Kemp Q [place], male    DOB: [birth year], 24 y.o.   MRN: [id number]  HPI He is here for routine followup.Marland Kitchen. He was started on Stribild in April 2014 and has been taking it daily.  He denies any side effects, no fatigue or any other issues and is pleased with regimen. No labs prior to visit.  Unfortunately, he did not renew ADAP and is about to run out of medication.     Review of Systems  Constitutional: Negative for fever, fatigue and unexpected weight change.  HENT: Negative for sore throat and trouble swallowing.   Gastrointestinal: Negative for nausea and diarrhea.  Skin: Negative for rash.  Neurological: Negative for dizziness, light-headedness and headaches.  Hematological: Negative for adenopathy.  Psychiatric/Behavioral: Negative for dysphoric mood.       Objective:   Physical Exam  Constitutional: He appears well-developed and well-nourished. No distress.  HENT:  Mouth/Throat: No oropharyngeal exudate.  Eyes: No scleral icterus.  Cardiovascular: Normal rate, regular rhythm and normal heart sounds.   No murmur heard. Pulmonary/Chest: Effort normal and breath sounds normal. No respiratory distress.  Lymphadenopathy:    He has no cervical adenopathy.  Skin: No rash noted.          Assessment & Plan:

## 2015-04-30 ENCOUNTER — Other Ambulatory Visit: Payer: Self-pay | Admitting: *Deleted

## 2015-04-30 DIAGNOSIS — B20 Human immunodeficiency virus [HIV] disease: Secondary | ICD-10-CM

## 2015-04-30 LAB — T-HELPER CELL (CD4) - (RCID CLINIC ONLY)
CD4 T CELL HELPER: 45 % (ref 33–55)
CD4 T Cell Abs: 1010 /uL (ref 400–2700)

## 2015-04-30 MED ORDER — ELVITEG-COBIC-EMTRICIT-TENOFDF 150-150-200-300 MG PO TABS: 1.0000 | ORAL_TABLET | Freq: Every day | ORAL | Status: DC

## 2015-04-30 NOTE — Telephone Encounter (Signed)
ADAP Application 

## 2015-05-01 LAB — HIV-1 RNA QUANT-NO REFLEX-BLD
HIV 1 RNA Quant: <20 copies/mL (ref <20)
HIV-1 RNA Quant, Log: <1.3 {Log} (ref <1.30)

## 2015-05-23 ENCOUNTER — Encounter (HOSPITAL_COMMUNITY): Payer: Self-pay | Admitting: Emergency Medicine

## 2015-05-23 ENCOUNTER — Emergency Department (HOSPITAL_COMMUNITY): Payer: No Typology Code available for payment source

## 2015-05-23 ENCOUNTER — Emergency Department (HOSPITAL_COMMUNITY)
Admission: EM | Admit: 2015-05-23 | Discharge: 2015-05-23 | Disposition: A | Discharge status: Home / Self Care | Payer: No Typology Code available for payment source | Attending: Emergency Medicine | Admitting: Emergency Medicine

## 2015-05-23 DIAGNOSIS — B20 Human immunodeficiency virus [HIV] disease: Secondary | ICD-10-CM | POA: Diagnosis not present

## 2015-05-23 DIAGNOSIS — S59911A Unspecified injury of right forearm, initial encounter: Secondary | ICD-10-CM | POA: Diagnosis present

## 2015-05-23 DIAGNOSIS — S5011XA Contusion of right forearm, initial encounter: Secondary | ICD-10-CM | POA: Insufficient documentation

## 2015-05-23 DIAGNOSIS — Y9389 Activity, other specified: Secondary | ICD-10-CM | POA: Diagnosis not present

## 2015-05-23 DIAGNOSIS — Z79899 Other long term (current) drug therapy: Secondary | ICD-10-CM | POA: Diagnosis not present

## 2015-05-23 DIAGNOSIS — Y998 Other external cause status: Secondary | ICD-10-CM | POA: Insufficient documentation

## 2015-05-23 DIAGNOSIS — Y92410 Unspecified street and highway as the place of occurrence of the external cause: Secondary | ICD-10-CM | POA: Insufficient documentation

## 2015-05-23 DIAGNOSIS — S5012XA Contusion of left forearm, initial encounter: Secondary | ICD-10-CM | POA: Insufficient documentation

## 2015-05-23 DIAGNOSIS — Z720 Tobacco use: Secondary | ICD-10-CM | POA: Insufficient documentation

## 2015-05-23 MED ORDER — NAPROXEN 500 MG PO TABS: 500.0000 mg | ORAL_TABLET | Freq: Two times a day (BID) | ORAL | Status: DC
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
## 2015-06-17 ENCOUNTER — Ambulatory Visit (INDEPENDENT_AMBULATORY_CARE_PROVIDER_SITE_OTHER): Payer: Self-pay | Admitting: Internal Medicine

## 2015-06-17 ENCOUNTER — Encounter: Payer: Self-pay | Admitting: Internal Medicine

## 2015-06-17 VITALS — BP 136/77 | HR 89 | Temp 98.2°F | Ht 68.0 in | Wt 128.0 lb

## 2015-06-17 DIAGNOSIS — B20 Human immunodeficiency virus [HIV] disease: Secondary | ICD-10-CM

## 2015-06-17 DIAGNOSIS — Z113 Encounter for screening for infections with a predominantly sexual mode of transmission: Secondary | ICD-10-CM

## 2015-06-17 MED ORDER — ELVITEG-COBIC-EMTRICIT-TENOFAF 150-150-200-10 MG PO TABS: 1.0000 | ORAL_TABLET | Freq: Every day | ORAL | Status: DC

## 2015-06-17 NOTE — Assessment & Plan Note (Signed)
Doing well.  Discussed Genvoya and will change with next refill.  Labs today and rtc 4 months with labs before if no concerns.

## 2015-06-17 NOTE — Progress Notes (Signed)
  Subjective:    Patient ID: Patrick Glass, male    DOB: 1991-03-06, 24 y.o.   MRN: [id number]  HPI He is here for routine followup.Marland Kitchen. He was started on Stribild in April 2014 and has been taking it daily.  He denies any side effects, no fatigue or any other issues and is pleased with regimen. No labs prior to visit.  Unfortunately, he did not renew ADAP last time but fortunately did not run out of medicine and no tretament interruption.      Review of Systems  Constitutional: Negative for fever, fatigue and unexpected weight change.  HENT: Negative for sore throat and trouble swallowing.   Gastrointestinal: Negative for nausea and diarrhea.  Skin: Negative for rash.  Neurological: Negative for dizziness, light-headedness and headaches.  Hematological: Negative for adenopathy.  Psychiatric/Behavioral: Negative for dysphoric mood.       Objective:   Physical Exam  Constitutional: He appears well-developed and well-nourished. No distress.  HENT:  Mouth/Throat: No oropharyngeal exudate.  Eyes: No scleral icterus.  Cardiovascular: Normal rate, regular rhythm and normal heart sounds.   No murmur heard. Pulmonary/Chest: Effort normal and breath sounds normal. No respiratory distress.  Lymphadenopathy:    He has no cervical adenopathy.  Skin: No rash noted.          Assessment & Plan:

## 2015-06-18 LAB — HIV-1 RNA QUANT-NO REFLEX-BLD
HIV 1 RNA Quant: <20 copies/mL (ref <20)
HIV-1 RNA Quant, Log: <1.3 {Log} (ref <1.30)

## 2015-06-18 LAB — T-HELPER CELL (CD4) - (RCID CLINIC ONLY)
CD4 % Helper T Cell: 44 % (ref 33–55)
CD4 T CELL ABS: 740 /uL (ref 400–2700)

## 2015-07-07 ENCOUNTER — Telehealth: Payer: Self-pay | Admitting: *Deleted

## 2015-07-07 NOTE — Telephone Encounter (Signed)
Patient will come 8/2 9:15 for ADAP renewal.  He understands what to bring for recertification. Andree Coss, RN

## 2015-07-08 ENCOUNTER — Ambulatory Visit: Payer: Self-pay

## 2015-07-15 ENCOUNTER — Ambulatory Visit: Payer: Self-pay

## 2015-09-01 ENCOUNTER — Telehealth: Payer: Self-pay | Admitting: *Deleted

## 2015-09-01 NOTE — Telephone Encounter (Signed)
Patient called and left a voice mail stating he thinks he has an STD. When I called patient back he said [facility]. Wendall Mola

## 2015-09-20 ENCOUNTER — Encounter (HOSPITAL_COMMUNITY): Payer: Self-pay | Admitting: Emergency Medicine

## 2015-09-20 ENCOUNTER — Emergency Department (HOSPITAL_COMMUNITY)
Admission: EM | Admit: 2015-09-20 | Discharge: 2015-09-20 | Disposition: A | Discharge status: Home / Self Care | Payer: No Typology Code available for payment source | Attending: Emergency Medicine | Admitting: Emergency Medicine

## 2015-09-20 DIAGNOSIS — Z79899 Other long term (current) drug therapy: Secondary | ICD-10-CM | POA: Diagnosis not present

## 2015-09-20 DIAGNOSIS — S0990XA Unspecified injury of head, initial encounter: Secondary | ICD-10-CM | POA: Diagnosis not present

## 2015-09-20 DIAGNOSIS — S161XXA Strain of muscle, fascia and tendon at neck level, initial encounter: Secondary | ICD-10-CM

## 2015-09-20 DIAGNOSIS — Y998 Other external cause status: Secondary | ICD-10-CM | POA: Diagnosis not present

## 2015-09-20 DIAGNOSIS — S199XXA Unspecified injury of neck, initial encounter: Secondary | ICD-10-CM | POA: Diagnosis present

## 2015-09-20 DIAGNOSIS — Y9389 Activity, other specified: Secondary | ICD-10-CM | POA: Diagnosis not present

## 2015-09-20 DIAGNOSIS — B20 Human immunodeficiency virus [HIV] disease: Secondary | ICD-10-CM | POA: Insufficient documentation

## 2015-09-20 DIAGNOSIS — Y92410 Unspecified street and highway as the place of occurrence of the external cause: Secondary | ICD-10-CM | POA: Diagnosis not present

## 2015-09-20 MED ORDER — METHOCARBAMOL 500 MG PO TABS: 500.0000 mg | ORAL_TABLET | Freq: Three times a day (TID) | ORAL | Status: DC | PRN

## 2015-09-20 MED ORDER — IBUPROFEN 800 MG PO TABS
800.0000 mg | ORAL_TABLET | Freq: Once | ORAL | Status: AC
  Administered 2015-09-20: 800 mg via ORAL
  Filled 2015-09-20: qty 1

## 2015-09-20 MED ORDER — NAPROXEN 500 MG PO TABS: 500.0000 mg | ORAL_TABLET | Freq: Two times a day (BID) | ORAL | Status: DC

## 2015-09-20 MED ORDER — METHOCARBAMOL 500 MG PO TABS
1000.0000 mg | ORAL_TABLET | Freq: Once | ORAL | Status: AC
  Administered 2015-09-20: 1000 mg via ORAL
  Filled 2015-09-20: qty 2

## 2015-09-20 NOTE — Discharge Instructions (Signed)

## 2015-09-20 NOTE — ED Notes (Signed)
Bed: WA22 Expected date:  Expected time:  Means of arrival:  Comments: 
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
## 2015-10-16 ENCOUNTER — Other Ambulatory Visit (INDEPENDENT_AMBULATORY_CARE_PROVIDER_SITE_OTHER): Payer: Self-pay

## 2015-10-16 DIAGNOSIS — B20 Human immunodeficiency virus [HIV] disease: Secondary | ICD-10-CM

## 2015-10-16 DIAGNOSIS — Z113 Encounter for screening for infections with a predominantly sexual mode of transmission: Secondary | ICD-10-CM

## 2015-10-17 LAB — RPR: RPR Ser Ql: REACTIVE — AB

## 2015-10-17 LAB — FLUORESCENT TREPONEMAL AB(FTA)-IGG-BLD: Fluorescent Treponemal ABS: REACTIVE — AB

## 2015-10-17 LAB — URINE CYTOLOGY ANCILLARY ONLY
Chlamydia: NEGATIVE
Neisseria Gonorrhea: NEGATIVE

## 2015-10-17 LAB — HIV-1 RNA QUANT-NO REFLEX-BLD
HIV 1 RNA Quant: <20 copies/mL (ref <20)
HIV-1 RNA Quant, Log: <1.3 Log copies/mL (ref <1.30)

## 2015-10-17 LAB — T-HELPER CELL (CD4) - (RCID CLINIC ONLY)
CD4 T CELL ABS: 850 /uL (ref 400–2700)
CD4 T CELL HELPER: 40 % (ref 33–55)

## 2015-10-17 LAB — RPR TITER

## 2015-10-17 NOTE — Progress Notes (Signed)
Faxed notification to [facility]. Rossie Bretado M, RN 

## 2015-10-28 ENCOUNTER — Encounter: Payer: Self-pay | Admitting: Internal Medicine

## 2015-10-28 ENCOUNTER — Ambulatory Visit (INDEPENDENT_AMBULATORY_CARE_PROVIDER_SITE_OTHER): Payer: Self-pay | Admitting: Internal Medicine

## 2015-10-28 VITALS — BP 150/93 | HR 99 | Temp 98.2°F | Wt 134.0 lb

## 2015-10-28 DIAGNOSIS — Z113 Encounter for screening for infections with a predominantly sexual mode of transmission: Secondary | ICD-10-CM

## 2015-10-28 DIAGNOSIS — B20 Human immunodeficiency virus [HIV] disease: Secondary | ICD-10-CM

## 2015-10-28 DIAGNOSIS — Z79899 Other long term (current) drug therapy: Secondary | ICD-10-CM

## 2015-10-28 DIAGNOSIS — Z23 Encounter for immunization: Secondary | ICD-10-CM

## 2015-10-28 NOTE — Assessment & Plan Note (Signed)
Will screen RPR again next visit

## 2015-10-28 NOTE — Assessment & Plan Note (Signed)
Doing well on Genvoya.  RTC 4 months.

## 2015-10-28 NOTE — Progress Notes (Signed)
Patient ID: Patrick Glass, male   DOB: 09-Dec-1990, 24 y.o.   MRN: [id number] CC: Follow up for HIV  Interval history: Currently is asymptomatic and well-controlled on Genvoya.  Since last visit no new issues  Has no associated n/v/d.  Denies any missed doses.      Prior to Admission medications   Medication Sig Start Date End Date Taking? Authorizing Provider  elvitegravir-cobicistat-emtricitabine-tenofovir (GENVOYA) 150-150-200-10 MG TABS tablet Take 1 tablet by mouth daily. 06/17/15  Yes Gardiner Barefootobert W Cletis Clack, MD    Review of Systems Constitutional: negative for malaise Gastrointestinal: negative for diarrhea All other systems reviewed and are negative   Physical Exam: CONSTITUTIONAL:in no apparent distress and alert  Filed Vitals:   10/28/15 1057  BP: 150/93  Pulse: 99  Temp: 98.2 F (36.8 C)   Eyes: anicteric HENT: no thrush, no cervical lymphadenopathy Respiratory: Normal respiratory effort; CTA B  Lab Results  Component Value Date   HIV1RNAQUANT <20 10/16/2015   HIV1RNAQUANT <20 06/17/2015   HIV1RNAQUANT <20 04/29/2015   No components found for: HIV1GENOTYPRPLUS No components found for: THELPERCELL

## 2015-10-28 NOTE — Addendum Note (Signed)
Addended by: Wendall MolaOCKERHAM, JACQUELINE A on: 10/28/2015 11:28 AM   Modules accepted: Orders

## 2015-10-29 ENCOUNTER — Other Ambulatory Visit (HOSPITAL_COMMUNITY)
Admission: RE | Admit: 2015-10-29 | Discharge: 2015-10-29 | Disposition: A | Discharge status: Home / Self Care | Payer: No Typology Code available for payment source | Source: Ambulatory Visit | Attending: Emergency Medicine | Admitting: Emergency Medicine

## 2015-10-29 ENCOUNTER — Emergency Department (HOSPITAL_COMMUNITY)
Admission: EM | Admit: 2015-10-29 | Discharge: 2015-10-29 | Disposition: A | Discharge status: Home / Self Care | Payer: Self-pay | Source: Home / Self Care | Attending: Emergency Medicine | Admitting: Emergency Medicine

## 2015-10-29 ENCOUNTER — Encounter (HOSPITAL_COMMUNITY): Payer: Self-pay | Admitting: Emergency Medicine

## 2015-10-29 DIAGNOSIS — N485 Ulcer of penis: Secondary | ICD-10-CM

## 2015-10-29 DIAGNOSIS — Z113 Encounter for screening for infections with a predominantly sexual mode of transmission: Secondary | ICD-10-CM | POA: Insufficient documentation

## 2015-10-29 LAB — POCT URINALYSIS DIP (DEVICE)
BILIRUBIN URINE: NEGATIVE
Glucose, UA: NEGATIVE mg/dL
Hgb urine dipstick: NEGATIVE
Ketones, ur: NEGATIVE mg/dL
LEUKOCYTES UA: NEGATIVE
Nitrite: NEGATIVE
Protein, ur: NEGATIVE mg/dL
Specific Gravity, Urine: 1.015 (ref 1.005–1.030)
UROBILINOGEN UA: 1 mg/dL (ref 0.0–1.0)
pH: 7.5 (ref 5.0–8.0)

## 2015-10-29 MED ORDER — CEFTRIAXONE SODIUM 250 MG IJ SOLR
INTRAMUSCULAR | Status: AC
  Filled 2015-10-29: qty 250

## 2015-10-29 MED ORDER — PENICILLIN G BENZATHINE 1200000 UNIT/2ML IM SUSP
INTRAMUSCULAR | Status: AC
  Filled 2015-10-29: qty 2

## 2015-10-29 MED ORDER — AZITHROMYCIN 250 MG PO TABS
1000.0000 mg | ORAL_TABLET | Freq: Once | ORAL | Status: AC
  Administered 2015-10-29: 1000 mg via ORAL

## 2015-10-29 MED ORDER — CEFTRIAXONE SODIUM 250 MG IJ SOLR
250.0000 mg | Freq: Once | INTRAMUSCULAR | Status: AC
  Administered 2015-10-29: 250 mg via INTRAMUSCULAR

## 2015-10-29 MED ORDER — AZITHROMYCIN 250 MG PO TABS
ORAL_TABLET | ORAL | Status: AC
  Filled 2015-10-29: qty 4

## 2015-10-29 MED ORDER — PENICILLIN G BENZATHINE 1200000 UNIT/2ML IM SUSP
2.4000 10*6.[IU] | Freq: Once | INTRAMUSCULAR | Status: AC
  Administered 2015-10-29: 2.4 10*6.[IU] via INTRAMUSCULAR

## 2015-10-29 NOTE — ED Notes (Signed)
[facility] a complaint of a penile sore that he noticed 3 days prior. The patient denied any other symptoms.

## 2015-10-29 NOTE — ED Provider Notes (Signed)
CSN: [id number]     Arrival date & time 10/29/15  1341 History   First MD Initiated Contact with Patient 10/29/15 1410     Chief Complaint  Patient presents with  . SEXUALLY TRANSMITTED DISEASE   (Consider location/radiation/quality/duration/timing/severity/associated sxs/prior Treatment) HPI  He is a 24 year old man here for evaluation of penile lesion. He states 2 days ago he noticed a red bump on the shaft of his penis. Today, it is more of an ulcer. He denies any pain. No penile discharge. No dysuria. No abdominal pain. No fevers or chills. He reports unprotected sex one week ago.  He does have HIV and is taking medication. Based on review of labs, he is well controlled.  Past Medical History  Diagnosis Date  . HIV (human immunodeficiency virus infection) [facility](HCC)    Past Surgical History  Procedure Laterality Date  . Finger surgery     History reviewed. No pertinent family history. Social History  Substance Use Topics  . Smoking status: Never Smoker   . Smokeless tobacco: Never Used  . Alcohol Use: 2.4 oz/week    4 Standard drinks or equivalent per week     Comment: vodka, occ    Review of Systems As in history of present illness Allergies  Review of patient's allergies indicates no known allergies.  Home Medications   Prior to Admission medications   Medication Sig Start Date End Date Taking? Authorizing Provider  elvitegravir-cobicistat-emtricitabine-tenofovir (GENVOYA) 150-150-200-10 MG TABS tablet Take 1 tablet by mouth daily. 06/17/15   Gardiner Barefootobert W Comer, MD   Meds Ordered and Administered this Visit   Medications  cefTRIAXone (ROCEPHIN) injection 250 mg (not administered)  azithromycin (ZITHROMAX) tablet 1,000 mg (not administered)  penicillin g benzathine (BICILLIN LA) 1200000 UNIT/2ML injection 2.4 Million Units (not administered)    BP 125/80 mmHg  Pulse 83  Temp(Src) 97.2 F (36.2 C) (Oral)  Resp 18  SpO2 100% No data found.   Physical Exam    Constitutional: He is oriented to person, place, and time. He appears well-developed and well-nourished. No distress.  Cardiovascular: Normal rate.   Pulmonary/Chest: Effort normal.  Genitourinary:    Circumcised. No discharge found.  Lymphadenopathy:       Right: No inguinal adenopathy present.       Left: Inguinal adenopathy present.  Neurological: He is alert and oriented to person, place, and time.    ED Course  Procedures (including critical care time)  Labs Review Labs Reviewed  RPR  POCT URINALYSIS DIP (DEVICE)  URINE CYTOLOGY ANCILLARY ONLY    Imaging Review No results found.    MDM   1. Penile ulcer    We'll treat presumptively for gonorrhea, chlamydia, and syphilis. He states he has had syphilis in the past, so we'll need to assess titer levels. He did have RPR and titers done earlier this month which were at his baseline. Blood cultures sent for STD testing. Follow-up with PCP as needed.    Charm RingsErin J Arney Mayabb, MD 10/29/15 [phone]

## 2015-10-29 NOTE — Discharge Instructions (Signed)
We collected STD testing today. We treated you presumptively for gonorrhea, chlamydia, and syphilis. Please follow-up with your primary care doctor as needed.

## 2015-10-31 LAB — URINE CYTOLOGY ANCILLARY ONLY
Chlamydia: NEGATIVE
Neisseria Gonorrhea: NEGATIVE
TRICH (WINDOWPATH): NEGATIVE

## 2015-10-31 LAB — RPR, QUANT+TP ABS (REFLEX): TREPONEMA PALLIDUM AB: POSITIVE — AB

## 2015-10-31 LAB — RPR: RPR: REACTIVE — AB

## 2015-10-31 NOTE — ED Notes (Signed)
Final report of urine cytology negative for pathogens

## 2015-11-03 NOTE — ED Notes (Signed)
Final report of RPR shows Trep Pallidum 1:8, ('high") . Discussed w JD Kindl, who fells lab report reflective of new occurrence , 2.4 million units as a one time dose for treatment. Called and left message for patient to call us. UC culture negatice , other STD labs negative

## 2015-11-04 NOTE — ED Notes (Addendum)
Note entered by B Wooters indicated administered LA Bicillin  2.4 mu day of visit,  treatment adequate for 2124 [employer]HS completed, [facility]

## 2015-12-26 ENCOUNTER — Other Ambulatory Visit: Payer: Self-pay | Admitting: Internal Medicine

## 2016-02-10 ENCOUNTER — Emergency Department (HOSPITAL_COMMUNITY)
Admission: EM | Admit: 2016-02-10 | Discharge: 2016-02-10 | Disposition: A | Discharge status: Home / Self Care | Payer: Self-pay | Source: Home / Self Care | Attending: Family Medicine | Admitting: Family Medicine

## 2016-02-10 ENCOUNTER — Encounter (HOSPITAL_COMMUNITY): Payer: Self-pay | Admitting: *Deleted

## 2016-02-10 DIAGNOSIS — S30812A Abrasion of penis, initial encounter: Secondary | ICD-10-CM

## 2016-02-10 NOTE — ED Notes (Signed)
Pt     Walled  Out  ot  Treatment  Room  And  Briskly    exixted  The  Building  Refusing   To  Sign  Papers  Or  Wait   For  Any  Discharge  Instructions

## 2016-02-10 NOTE — Discharge Instructions (Signed)
See your doctor as needed

## 2016-02-10 NOTE — ED Notes (Signed)
Pt  Wants  To  Be  Checked  For  An  Std   He  Noticed  A  Lesion on  yhis  Penis     2  Days  Ago   He  denys  Any   Penile  Discharge     He  States  He  Has a  History of  Treated  syphllis       In past

## 2016-02-10 NOTE — ED Provider Notes (Signed)
CSN: [id number]     Arrival date & time 02/10/16  1920 History   None    Chief Complaint  Patient presents with  . Exposure to STD   (Consider location/radiation/quality/duration/timing/severity/associated sxs/prior Treatment) Patient is a 25 y.o. male presenting with STD exposure. The history is provided by the patient and a friend.  Exposure to STD This is a new problem. The current episode started 2 days ago (minor abrasion to right midpenile shaft, no drainage or erythema, concern for syphyllis, started this way before.). The problem has not changed since onset.Pertinent negatives include no chest pain and no abdominal pain.    Past Medical History  Diagnosis Date  . HIV (human immunodeficiency virus infection) [facility](HCC)    Past Surgical History  Procedure Laterality Date  . Finger surgery     History reviewed. No pertinent family history. Social History  Substance Use Topics  . Smoking status: Never Smoker   . Smokeless tobacco: Never Used  . Alcohol Use: 2.4 oz/week    4 Standard drinks or equivalent per week     Comment: vodka, occ    Review of Systems  Constitutional: Negative.   Cardiovascular: Negative for chest pain.  Gastrointestinal: Negative for abdominal pain.  Genitourinary: Negative for discharge, penile swelling, scrotal swelling, penile pain and testicular pain.  Skin: Positive for rash and wound.  All other systems reviewed and are negative.   Allergies  Review of patient's allergies indicates no known allergies.  Home Medications   Prior to Admission medications   Medication Sig Start Date End Date Taking? Authorizing Provider  GENVOYA 150-150-200-10 MG TABS tablet TAKE 1 TABLET BY MOUTH DAILY 12/26/15   Gardiner Barefootobert W Comer, MD   Meds Ordered and Administered this Visit  Medications - No data to display  BP 126/79 mmHg  Pulse 59  Temp(Src) 98.4 F (36.9 C) (Oral)  Resp 12  SpO2 100% No data found.   Physical Exam  Constitutional: He is oriented  to person, place, and time. He appears well-developed and well-nourished. No distress.  Genitourinary: No penile tenderness.  Minor 1mm crusted papule to right midpenile shaft, no adenopathy. No drainage, no erythema  Neurological: He is alert and oriented to person, place, and time.  Skin: Skin is warm and dry. No rash noted. No erythema. No pallor.  Nursing note and vitals reviewed.   ED Course  Procedures (including critical care time)  Labs Review Labs Reviewed - No data to display  Imaging Review No results found.   Visual Acuity Review  Right Eye Distance:   Left Eye Distance:   Bilateral Distance:    Right Eye Near:   Left Eye Near:    Bilateral Near:         MDM   1. Penile abrasion, initial encounter       Linna HoffJames D Kindl, MD 02/10/16 2108

## 2016-02-10 NOTE — ED Notes (Deleted)
Bed: UC08 Expected date: 02/10/16 Expected time: 8:35 PM Means of arrival:  Comments: Bleach

## 2016-02-24 ENCOUNTER — Other Ambulatory Visit (INDEPENDENT_AMBULATORY_CARE_PROVIDER_SITE_OTHER): Payer: Self-pay

## 2016-02-24 DIAGNOSIS — Z113 Encounter for screening for infections with a predominantly sexual mode of transmission: Secondary | ICD-10-CM

## 2016-02-24 DIAGNOSIS — B20 Human immunodeficiency virus [HIV] disease: Secondary | ICD-10-CM

## 2016-02-24 DIAGNOSIS — Z79899 Other long term (current) drug therapy: Secondary | ICD-10-CM

## 2016-02-24 LAB — COMPLETE METABOLIC PANEL WITH GFR
ALBUMIN: 4.1 g/dL (ref 3.6–5.1)
ALK PHOS: 67 U/L (ref 40–115)
ALT: 14 U/L (ref 9–46)
AST: 17 U/L (ref 10–40)
BUN: 11 mg/dL (ref 7–25)
CALCIUM: 9.3 mg/dL (ref 8.6–10.3)
CHLORIDE: 104 mmol/L (ref 98–110)
CO2: 26 mmol/L (ref 20–31)
CREATININE: 1.19 mg/dL (ref 0.60–1.35)
GFR, Est African American: >89 mL/min (ref >=60)
GFR, Est Non African American: 86 mL/min (ref >=60)
Glucose, Bld: 87 mg/dL (ref 65–99)
Potassium: 3.9 mmol/L (ref 3.5–5.3)
Sodium: 137 mmol/L (ref 135–146)
TOTAL PROTEIN: 6.9 g/dL (ref 6.1–8.1)
Total Bilirubin: 0.2 mg/dL (ref 0.2–1.2)

## 2016-02-24 LAB — CBC WITH DIFFERENTIAL/PLATELET
BASOS ABS: 0 10*3/uL (ref 0.0–0.1)
BASOS PCT: 0 % (ref 0–1)
EOS ABS: 0.2 10*3/uL (ref 0.0–0.7)
EOS PCT: 5 % (ref 0–5)
HEMATOCRIT: 43 % (ref 39.0–52.0)
Hemoglobin: 14.9 g/dL (ref 13.0–17.0)
LYMPHS ABS: 1.8 10*3/uL (ref 0.7–4.0)
Lymphocytes Relative: 58 % — ABNORMAL HIGH (ref 12–46)
MCH: 34.1 pg — ABNORMAL HIGH (ref 26.0–34.0)
MCHC: 34.7 g/dL (ref 30.0–36.0)
MCV: 98.4 fL (ref 78.0–100.0)
MPV: 9.7 fL (ref 8.6–12.4)
Monocytes Absolute: 0.3 10*3/uL (ref 0.1–1.0)
Monocytes Relative: 9 % (ref 3–12)
NEUTROS PCT: 28 % — AB (ref 43–77)
Neutro Abs: 0.9 10*3/uL — ABNORMAL LOW (ref 1.7–7.7)
Platelets: 248 10*3/uL (ref 150–400)
RBC: 4.37 MIL/uL (ref 4.22–5.81)
RDW: 12.6 % (ref 11.5–15.5)
WBC: 3.1 10*3/uL — AB (ref 4.0–10.5)

## 2016-02-24 LAB — LIPID PANEL
CHOL/HDL RATIO: 3.5 ratio (ref <=5.0)
CHOLESTEROL: 204 mg/dL — AB (ref 125–200)
HDL: 58 mg/dL (ref >=40)
LDL Cholesterol: 109 mg/dL (ref <130)
TRIGLYCERIDES: 184 mg/dL — AB (ref <150)
VLDL: 37 mg/dL — ABNORMAL HIGH (ref <30)

## 2016-02-25 LAB — T-HELPER CELL (CD4) - (RCID CLINIC ONLY)
CD4 T CELL ABS: 890 /uL (ref 400–2700)
CD4 T CELL HELPER: 48 % (ref 33–55)

## 2016-02-25 LAB — URINE CYTOLOGY ANCILLARY ONLY
CHLAMYDIA, DNA PROBE: POSITIVE — AB
Neisseria Gonorrhea: NEGATIVE

## 2016-02-25 LAB — RPR TITER: RPR Titer: 1:8 {titer}

## 2016-02-25 LAB — RPR: RPR: REACTIVE — AB

## 2016-02-26 ENCOUNTER — Telehealth: Payer: Self-pay | Admitting: *Deleted

## 2016-02-26 LAB — HIV-1 RNA QUANT-NO REFLEX-BLD
HIV 1 RNA Quant: <20 copies/mL (ref <20)
HIV-1 RNA Quant, Log: <1.3 Log copies/mL (ref <1.30)

## 2016-02-26 LAB — FLUORESCENT TREPONEMAL AB(FTA)-IGG-BLD: FLUORESCENT TREPONEMAL ABS: REACTIVE — AB

## 2016-02-26 NOTE — Telephone Encounter (Signed)
-----   Message from Gardiner Barefootobert W Comer, MD sent at 02/26/2016  9:11 AM EDT ----- Chlamydia positive.  Please have him treated.  thanks

## 2016-02-26 NOTE — Telephone Encounter (Signed)
1 gram of azithromycin please.  Thanks!

## 2016-02-26 NOTE — Telephone Encounter (Signed)
Thanks

## 2016-02-26 NOTE — Telephone Encounter (Signed)
Please advise on treatment.  Pt will come 3/24. Andree CossHowell, Katelin Kutsch M, RN

## 2016-02-27 ENCOUNTER — Ambulatory Visit (INDEPENDENT_AMBULATORY_CARE_PROVIDER_SITE_OTHER): Payer: Self-pay | Admitting: *Deleted

## 2016-02-27 DIAGNOSIS — A749 Chlamydial infection, unspecified: Secondary | ICD-10-CM

## 2016-02-27 MED ORDER — AZITHROMYCIN 250 MG PO TABS
1000.0000 mg | ORAL_TABLET | Freq: Once | ORAL | Status: AC
  Administered 2016-02-27: 1000 mg via ORAL

## 2016-03-09 ENCOUNTER — Other Ambulatory Visit: Payer: Self-pay | Admitting: *Deleted

## 2016-03-09 ENCOUNTER — Encounter: Payer: Self-pay | Admitting: Internal Medicine

## 2016-03-09 ENCOUNTER — Ambulatory Visit (INDEPENDENT_AMBULATORY_CARE_PROVIDER_SITE_OTHER): Payer: Self-pay | Admitting: Internal Medicine

## 2016-03-09 VITALS — BP 133/84 | HR 73 | Temp 98.1°F | Ht 67.5 in | Wt 134.0 lb

## 2016-03-09 DIAGNOSIS — Z113 Encounter for screening for infections with a predominantly sexual mode of transmission: Secondary | ICD-10-CM

## 2016-03-09 DIAGNOSIS — A749 Chlamydial infection, unspecified: Secondary | ICD-10-CM | POA: Insufficient documentation

## 2016-03-09 DIAGNOSIS — B20 Human immunodeficiency virus [HIV] disease: Secondary | ICD-10-CM

## 2016-03-09 MED ORDER — ELVITEG-COBIC-EMTRICIT-TENOFAF 150-150-200-10 MG PO TABS: 1.0000 | ORAL_TABLET | Freq: Every day | ORAL | Status: DC

## 2016-03-09 NOTE — Progress Notes (Signed)
Patient ID: Patrick Glass, male   DOB: 1991/07/23, 25 y.o.   MRN: [id number] CC: Follow up for HIV  Interval history: Currently is asymptomatic and well-controlled on Genvoya.  Since last visit no new issues  Has no associated n/v/d.  Denies any missed doses.   Did get treatment for chlamydia from his longtime, but now former, partner.  Working a new job and likes it. Unfortunately is out of his medication and looks like his renewal hasn't gone through yet.    Prior to Admission medications   Medication Sig Start Date End Date Taking? Authorizing Provider  elvitegravir-cobicistat-emtricitabine-tenofovir (GENVOYA) 150-150-200-10 MG TABS tablet Take 1 tablet by mouth daily. 06/17/15  Yes Gardiner Barefootobert W Sidi Dzikowski, MD    Review of Systems Constitutional: negative for malaise Gastrointestinal: negative for diarrhea All other systems reviewed and are negative   Physical Exam: CONSTITUTIONAL:in no apparent distress and alert  Filed Vitals:   03/09/16 1019  BP: 133/84  Pulse: 73  Temp: 98.1 F (36.7 C)   Eyes: anicteric HENT: no thrush, no cervical lymphadenopathy Respiratory: Normal respiratory effort; CTA B  Lab Results  Component Value Date   HIV1RNAQUANT <20 02/24/2016   HIV1RNAQUANT <20 10/16/2015   HIV1RNAQUANT <20 06/17/2015   No components found for: HIV1GENOTYPRPLUS No components found for: THELPERCELL

## 2016-03-09 NOTE — Assessment & Plan Note (Signed)
Recently treated.  Counseled. Condoms offered.

## 2016-03-09 NOTE — Assessment & Plan Note (Signed)
Will screen twice yearly with his risk factors.

## 2016-03-09 NOTE — Assessment & Plan Note (Signed)
Doing well and will see if his renewal is valid.  RTC 6 months unless he has a lapse in treatment.

## 2016-04-08 ENCOUNTER — Encounter: Payer: Self-pay | Admitting: Internal Medicine

## 2016-08-02 ENCOUNTER — Ambulatory Visit: Payer: Self-pay

## 2016-08-03 ENCOUNTER — Encounter: Payer: Self-pay | Admitting: Internal Medicine

## 2016-08-28 ENCOUNTER — Other Ambulatory Visit: Payer: Self-pay | Admitting: Internal Medicine

## 2016-08-28 DIAGNOSIS — B20 Human immunodeficiency virus [HIV] disease: Secondary | ICD-10-CM

## 2016-11-06 ENCOUNTER — Ambulatory Visit (HOSPITAL_COMMUNITY)
Admission: EM | Admit: 2016-11-06 | Discharge: 2016-11-06 | Disposition: A | Discharge status: Home / Self Care | Payer: Self-pay | Attending: Emergency Medicine | Admitting: Emergency Medicine

## 2016-11-06 ENCOUNTER — Encounter (HOSPITAL_COMMUNITY): Payer: Self-pay | Admitting: Emergency Medicine

## 2016-11-06 DIAGNOSIS — R599 Enlarged lymph nodes, unspecified: Secondary | ICD-10-CM | POA: Insufficient documentation

## 2016-11-06 DIAGNOSIS — B20 Human immunodeficiency virus [HIV] disease: Secondary | ICD-10-CM | POA: Insufficient documentation

## 2016-11-06 DIAGNOSIS — R21 Rash and other nonspecific skin eruption: Secondary | ICD-10-CM | POA: Insufficient documentation

## 2016-11-06 NOTE — ED Notes (Signed)
Call back number verified and updated in EPIC... Adv pt to not have SI until lab results comeback neg.... Also adv pt lab results will be on MyChart; instructions given .... Pt verb understanding.   

## 2016-11-06 NOTE — Discharge Instructions (Signed)
We collected swabs to test for various infections. For now, apply a little bit of Vaseline 2 times a day. We will call you with the results in 2-3 days.

## 2016-11-06 NOTE — ED Triage Notes (Signed)
[facility]. Patient states that there is no pain associated with the lesion. Patient states that he noticed it on Friday.

## 2016-11-06 NOTE — ED Provider Notes (Addendum)
[facility]    CSN: [id number] Arrival date & time: 11/06/16  1738     History   Chief Complaint Chief Complaint  Patient presents with  . S74.5    HPI Patrick Glass is a 25 y.o. male.   HPI He is a 25 year old man here for evaluation of penile lesion. He noticed a small bump or blister, shaft of his penis yesterday. Initially, it was painless, but after he messed with it he felt a burning. He denies any dysuria or penile discharge. No new sexual partners, but he does report unprotected sex recently.  Past Medical History:  Diagnosis Date  . HIV (human immunodeficiency virus infection) [facility](HCC)     Patient Active Problem List   Diagnosis Date Noted  . Chlamydia 03/09/2016  . Screening examination for venereal disease 07/09/2014  . Encounter for long-term (current) use of medications 07/09/2014  . Health care maintenance 09/27/2013  . Human immunodeficiency virus (HIV) disease (HCC) 04/03/2013    Past Surgical History:  Procedure Laterality Date  . FINGER SURGERY         Home Medications    Prior to Admission medications   Medication Sig Start Date End Date Taking? Authorizing Provider  GENVOYA 150-150-200-10 MG TABS tablet TAKE 1 TABLET BY MOUTH DAILY 08/30/16  Yes Gardiner Barefootobert W Comer, MD    Family History History reviewed. No pertinent family history.  Social History Social History  Substance Use Topics  . Smoking status: Never Smoker  . Smokeless tobacco: Never Used  . Alcohol use 2.4 oz/week    4 Standard drinks or equivalent per week     Comment: vodka, occ     Allergies   Patient has no known allergies.   Review of Systems Review of Systems As in history of present illness  Physical Exam Triage Vital Signs ED Triage Vitals [11/06/16 1802]  Enc Vitals Group     BP 113/68     Pulse Rate 66     Resp 16     Temp 98.6 F (37 C)     Temp Source Oral     SpO2      Weight      Height      Head Circumference      Peak Flow    Pain Score      Pain Loc      Pain Edu?      Excl. in GC?    No data found.   Updated Vital Signs BP 113/68 (BP Location: Left Arm)   Pulse 66   Temp 98.6 F (37 C) (Oral)   Resp 16   Visual Acuity Right Eye Distance:   Left Eye Distance:   Bilateral Distance:    Right Eye Near:   Left Eye Near:    Bilateral Near:     Physical Exam  Constitutional: He is oriented to person, place, and time. He appears well-developed and well-nourished. No distress.  Cardiovascular: Normal rate.   Pulmonary/Chest: Effort normal.  Genitourinary: Circumcised.     Lymphadenopathy: Inguinal adenopathy noted on the left side. No inguinal adenopathy noted on the right side.  Neurological: He is alert and oriented to person, place, and time.     UC Treatments / Results  Labs (all labs ordered are listed, but only abnormal results are displayed) Labs Reviewed  HSV CULTURE AND TYPING  URINE CYTOLOGY ANCILLARY ONLY    EKG  EKG Interpretation None       Radiology No results  found.  Procedures Procedures (including critical care time)  Medications Ordered in UC Medications - No data to display   Initial Impression / Assessment and Plan / UC Course  I have reviewed the triage vital signs and the nursing notes.  Pertinent labs & imaging results that were available during my care of the patient were reviewed by me and considered in my medical decision making (see chart for details).  Clinical Course     Urine sent for gonorrhea, chlamydia, trichomonas. HSV culture collected and sent. Blood drawn for syphilis. - Pt left prior to blood collection. We'll treat based on results. Follow-up as needed.  Final Clinical Impressions(s) / UC Diagnoses   Final diagnoses:  Penile rash    New Prescriptions Discharge Medication List as of 11/06/2016  6:44 PM       Charm RingsErin J Sadeel Fiddler, MD 11/06/16 [phone]    Charm RingsErin J Loren Sawaya, MD 11/06/16 1857

## 2016-11-08 LAB — URINE CYTOLOGY ANCILLARY ONLY
Chlamydia: NEGATIVE
NEISSERIA GONORRHEA: NEGATIVE
Trichomonas: NEGATIVE

## 2016-11-09 LAB — HSV CULTURE AND TYPING

## 2016-11-12 ENCOUNTER — Telehealth (HOSPITAL_COMMUNITY): Payer: Self-pay | Admitting: Emergency Medicine

## 2016-11-12 NOTE — Telephone Encounter (Signed)
-----   Message from Charm RingsErin J Honig, MD sent at 11/11/2016 12:46 PM EST ----- Please notify patient of negative herpes culture and negative STD testing.  EH

## 2016-11-12 NOTE — Telephone Encounter (Signed)
LM on [phone] Called to give lab results and to see how pt is doing from recent visit on 11/06/16 Notified pt in gen message of normal results and that there is NO need to call back Unless not feeling any better, not tolerating meds well or if wanting to know lab results. Also let pt know labs can be obtained from MyChart
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
## 2017-03-11 ENCOUNTER — Other Ambulatory Visit: Payer: Self-pay | Admitting: Internal Medicine

## 2017-03-11 DIAGNOSIS — B20 Human immunodeficiency virus [HIV] disease: Secondary | ICD-10-CM

## 2017-03-15 ENCOUNTER — Ambulatory Visit (INDEPENDENT_AMBULATORY_CARE_PROVIDER_SITE_OTHER): Payer: Self-pay

## 2017-03-15 ENCOUNTER — Ambulatory Visit: Payer: Self-pay

## 2017-03-15 ENCOUNTER — Other Ambulatory Visit: Payer: Self-pay | Admitting: *Deleted

## 2017-03-15 DIAGNOSIS — B20 Human immunodeficiency virus [HIV] disease: Secondary | ICD-10-CM

## 2017-03-15 DIAGNOSIS — Z79899 Other long term (current) drug therapy: Secondary | ICD-10-CM

## 2017-03-15 DIAGNOSIS — Z113 Encounter for screening for infections with a predominantly sexual mode of transmission: Secondary | ICD-10-CM

## 2017-03-15 LAB — COMPREHENSIVE METABOLIC PANEL
ALT: 9 U/L (ref 9–46)
AST: 15 U/L (ref 10–40)
Albumin: 4.3 g/dL (ref 3.6–5.1)
Alkaline Phosphatase: 50 U/L (ref 40–115)
BUN: 18 mg/dL (ref 7–25)
CALCIUM: 9.2 mg/dL (ref 8.6–10.3)
CO2: 24 mmol/L (ref 20–31)
CREATININE: 1.1 mg/dL (ref 0.60–1.35)
Chloride: 106 mmol/L (ref 98–110)
GLUCOSE: 84 mg/dL (ref 65–99)
Potassium: 4.3 mmol/L (ref 3.5–5.3)
SODIUM: 140 mmol/L (ref 135–146)
Total Bilirubin: 0.3 mg/dL (ref 0.2–1.2)
Total Protein: 6.8 g/dL (ref 6.1–8.1)

## 2017-03-15 LAB — CBC WITH DIFFERENTIAL/PLATELET
Basophils Absolute: 0 cells/uL (ref 0–200)
Basophils Relative: 0 %
Eosinophils Absolute: 56 cells/uL (ref 15–500)
Eosinophils Relative: 2 %
HEMATOCRIT: 40.9 % (ref 38.5–50.0)
Hemoglobin: 13.9 g/dL (ref 13.2–17.1)
LYMPHS PCT: 44 %
Lymphs Abs: 1232 cells/uL (ref 850–3900)
MCH: 33 pg (ref 27.0–33.0)
MCHC: 34 g/dL (ref 32.0–36.0)
MCV: 97.1 fL (ref 80.0–100.0)
MONOS PCT: 11 %
MPV: 9.4 fL (ref 7.5–12.5)
Monocytes Absolute: 308 cells/uL (ref 200–950)
NEUTROS PCT: 43 %
Neutro Abs: 1204 cells/uL — ABNORMAL LOW (ref 1500–7800)
PLATELETS: 232 10*3/uL (ref 140–400)
RBC: 4.21 MIL/uL (ref 4.20–5.80)
RDW: 13.1 % (ref 11.0–15.0)
WBC: 2.8 10*3/uL — AB (ref 3.8–10.8)

## 2017-03-15 LAB — LIPID PANEL
CHOL/HDL RATIO: 2.7 ratio (ref <5.0)
Cholesterol: 191 mg/dL (ref <200)
HDL: 72 mg/dL (ref >40)
LDL CALC: 104 mg/dL — AB (ref <100)
TRIGLYCERIDES: 77 mg/dL (ref <150)
VLDL: 15 mg/dL (ref <30)

## 2017-03-15 MED ORDER — ELVITEG-COBIC-EMTRICIT-TENOFAF 150-150-200-10 MG PO TABS: 1.0000 | ORAL_TABLET | Freq: Every day | ORAL | 5 refills | Status: DC

## 2017-03-16 ENCOUNTER — Encounter: Payer: Self-pay | Admitting: Internal Medicine

## 2017-03-16 LAB — URINE CYTOLOGY ANCILLARY ONLY
CHLAMYDIA, DNA PROBE: NEGATIVE
Neisseria Gonorrhea: NEGATIVE

## 2017-03-16 LAB — T-HELPER CELL (CD4) - (RCID CLINIC ONLY)
CD4 % Helper T Cell: 48 % (ref 33–55)
CD4 T Cell Abs: 640 /uL (ref 400–2700)

## 2017-03-16 LAB — RPR TITER: RPR Titer: 1:2 {titer}

## 2017-03-16 LAB — RPR: RPR: REACTIVE — AB

## 2017-03-16 LAB — FLUORESCENT TREPONEMAL AB(FTA)-IGG-BLD: Fluorescent Treponemal ABS: REACTIVE — AB

## 2017-03-17 LAB — HIV-1 RNA QUANT-NO REFLEX-BLD
HIV 1 RNA Quant: <20 copies/mL
HIV-1 RNA Quant, Log: <1.3 Log copies/mL

## 2017-03-22 ENCOUNTER — Ambulatory Visit (INDEPENDENT_AMBULATORY_CARE_PROVIDER_SITE_OTHER): Payer: Self-pay | Admitting: Internal Medicine

## 2017-03-22 ENCOUNTER — Encounter: Payer: Self-pay | Admitting: Internal Medicine

## 2017-03-22 VITALS — BP 126/78 | HR 72 | Temp 97.8°F | Ht 67.5 in | Wt 128.0 lb

## 2017-03-22 DIAGNOSIS — Z113 Encounter for screening for infections with a predominantly sexual mode of transmission: Secondary | ICD-10-CM

## 2017-03-22 DIAGNOSIS — B20 Human immunodeficiency virus [HIV] disease: Secondary | ICD-10-CM

## 2017-03-22 DIAGNOSIS — Z720 Tobacco use: Secondary | ICD-10-CM

## 2017-03-22 DIAGNOSIS — Z79899 Other long term (current) drug therapy: Secondary | ICD-10-CM

## 2017-03-22 NOTE — Assessment & Plan Note (Signed)
Doing well.  contiue genvoya and rtc 6 months.

## 2017-03-22 NOTE — Assessment & Plan Note (Signed)
Counseled on cessation 

## 2017-03-22 NOTE — Assessment & Plan Note (Signed)
Screened negative.  rpr stable from previous infection.  Will monitor every 6 months

## 2017-03-22 NOTE — Progress Notes (Signed)
   Subjective:    Patient ID: Patrick Glass, male    DOB: 1991-11-14, 26 y.o.   MRN: [id number]  HPI Here for follow up of HIV  I last saw him 1 year ago and had been back on medication. Since then he has had no new issus.  Endorses excellent compliance with no missed doses.  CD4 640 and viral load < 20.  He is sexually active and uses condoms always.  No penile discharge, no warts or lesions.  No weight loss or associated n/v/d.     Review of Systems  Constitutional: Negative for fatigue.  Gastrointestinal: Negative for diarrhea.  Skin: Negative for rash.       Objective:   Physical Exam  Constitutional: He appears well-developed and well-nourished. No distress.  HENT:  Mouth/Throat: No oropharyngeal exudate.  Eyes: No scleral icterus.  Cardiovascular: Normal rate, regular rhythm and normal heart sounds.   Pulmonary/Chest: Effort normal and breath sounds normal. No respiratory distress.  Abdominal: Soft. He exhibits no distension.  Lymphadenopathy:    He has no cervical adenopathy.  Skin: No rash noted.    SH: + tobacco, wants to quit      Assessment & Plan:

## 2017-03-22 NOTE — Assessment & Plan Note (Signed)
Creat wnl 

## 2017-03-31 NOTE — Progress Notes (Unsigned)
Patient ID: Patrick Glass, male   DOB: 06-03-1991, 26 y.o.   MRN: [id number]

## 2017-06-09 ENCOUNTER — Other Ambulatory Visit: Payer: Self-pay | Admitting: Internal Medicine

## 2017-06-09 DIAGNOSIS — B20 Human immunodeficiency virus [HIV] disease: Secondary | ICD-10-CM

## 2018-01-24 ENCOUNTER — Encounter (HOSPITAL_COMMUNITY): Payer: Self-pay | Admitting: Emergency Medicine

## 2018-01-24 ENCOUNTER — Emergency Department (HOSPITAL_COMMUNITY)
Admission: EM | Admit: 2018-01-24 | Discharge: 2018-01-24 | Disposition: A | Discharge status: Home / Self Care | Payer: Self-pay | Attending: Emergency Medicine | Admitting: Emergency Medicine

## 2018-01-24 DIAGNOSIS — F17210 Nicotine dependence, cigarettes, uncomplicated: Secondary | ICD-10-CM | POA: Insufficient documentation

## 2018-01-24 DIAGNOSIS — Z79899 Other long term (current) drug therapy: Secondary | ICD-10-CM | POA: Insufficient documentation

## 2018-01-24 DIAGNOSIS — R369 Urethral discharge, unspecified: Secondary | ICD-10-CM | POA: Insufficient documentation

## 2018-01-24 DIAGNOSIS — B20 Human immunodeficiency virus [HIV] disease: Secondary | ICD-10-CM | POA: Insufficient documentation

## 2018-01-24 MED ORDER — AZITHROMYCIN 250 MG PO TABS
1000.0000 mg | ORAL_TABLET | Freq: Once | ORAL | Status: AC
  Administered 2018-01-24: 1000 mg via ORAL
  Filled 2018-01-24: qty 4

## 2018-01-24 MED ORDER — CEFTRIAXONE SODIUM 250 MG IJ SOLR
250.0000 mg | Freq: Once | INTRAMUSCULAR | Status: AC
  Administered 2018-01-24: 250 mg via INTRAMUSCULAR
  Filled 2018-01-24: qty 250

## 2018-01-24 NOTE — ED Triage Notes (Signed)
Patient c/o yellow penile discharge x2 days. Requesting STD check. Denies pain.

## 2018-01-24 NOTE — Discharge Instructions (Signed)
It was my pleasure taking care of you today!   You have been tested for syphilis, gonorrhea and chlamydia. You will be notified in 3 days if results are positive.   Please follow-up with your primary care provider.  Return to the emergency department for fever, new or worsening symptoms or any additional concerns.

## 2018-01-24 NOTE — ED Provider Notes (Signed)
[facility]-EMERGENCY DEPT Provider Note   CSN: [id number] Arrival date & time: 01/24/18  1916     History   Chief Complaint Chief Complaint  Patient presents with  . Penile Discharge    HPI Patrick Glass is a 27 y.o. male.  The history is provided by the patient and medical records. No language interpreter was used.  Penile Discharge    Patrick Glass is a 27 y.o. male  with a PMH of HIV on Genvoya who presents to the Emergency Department complaining of discharge which he first noticed 2 days ago.  Denies any testicular or scrotal swelling/pain.  No dysuria, abdominal pain, n/v.  No GU lesions.  No fever or chills.  Did have unprotected intercourse and requesting STD check.  Past Medical History:  Diagnosis Date  . HIV (human immunodeficiency virus infection) [facility](HCC)     Patient Active Problem List   Diagnosis Date Noted  . Tobacco abuse 03/22/2017  . Screening examination for venereal disease 07/09/2014  . Encounter for long-term (current) use of medications 07/09/2014  . Health care maintenance 09/27/2013  . Human immunodeficiency virus (HIV) disease (HCC) 04/03/2013    Past Surgical History:  Procedure Laterality Date  . FINGER SURGERY         Home Medications    Prior to Admission medications   Medication Sig Start Date End Date Taking? Authorizing Provider  elvitegravir-cobicistat-emtricitabine-tenofovir (GENVOYA) 150-150-200-10 MG TABS tablet Take 1 tablet by mouth daily. 03/15/17   Gardiner Barefootomer, Robert W, MD  GENVOYA 150-150-200-10 MG TABS tablet TAKE 1 TABLET BY MOUTH DAILY 06/09/17   Comer, Belia Hemanobert W, MD    Family History No family history on file.  Social History Social History   Tobacco Use  . Smoking status: Current Some Day Smoker    Packs/day: 0.00    Types: Cigarettes  . Smokeless tobacco: Never Used  . Tobacco comment: weekends  Substance Use Topics  . Alcohol use: Yes    Alcohol/week: 8.4 oz    Types: 14 Standard  drinks or equivalent per week    Comment: vodka, a few times a week  . Drug use: Yes    Frequency: 1.0 times per week    Types: Marijuana    Comment: occ     Allergies   Patient has no known allergies.   Review of Systems Review of Systems  Genitourinary: Positive for discharge. Negative for difficulty urinating, dysuria, frequency, genital sores, penile pain, penile swelling, scrotal swelling and testicular pain.  All other systems reviewed and are negative.    Physical Exam Updated Vital Signs BP 124/79 (BP Location: Right Arm)   Pulse 68   Temp 98.8 F (37.1 C) (Oral)   Resp 16   Ht 5\' 7"  (1.702 m)   Wt 56.7 kg (125 lb)   SpO2 100%   BMI 19.58 kg/m   Physical Exam  Constitutional: He is oriented to person, place, and time. He appears well-developed and well-nourished. No distress.  HENT:  Head: Normocephalic and atraumatic.  Cardiovascular: Normal rate, regular rhythm and normal heart sounds.  No murmur heard. Pulmonary/Chest: Effort normal and breath sounds normal. No respiratory distress.  Abdominal: Soft. He exhibits no distension. There is no tenderness.  Genitourinary:  Genitourinary Comments: Chaperone present for exam. + discharge from penis. No signs of lesion or erythema on the penis or testicles. The penis and testicles are nontender. No testicular masses or swelling. No signs of any inguinal hernias. Cremaster reflex present bilaterally.  Neurological: He is alert and oriented to person, place, and time.  Skin: Skin is warm and dry.  Nursing note and vitals reviewed.    ED Treatments / Results  Labs (all labs ordered are listed, but only abnormal results are displayed) Labs Reviewed  RPR  GC/CHLAMYDIA PROBE AMP ([place]) NOT AT [facility]    EKG  EKG Interpretation None       Radiology No results found.  Procedures Procedures (including critical care time)  Medications Ordered in ED Medications  cefTRIAXone (ROCEPHIN) injection 250  mg (not administered)  azithromycin (ZITHROMAX) tablet 1,000 mg (not administered)     Initial Impression / Assessment and Plan / ED Course  I have reviewed the triage vital signs and the nursing notes.  Pertinent labs & imaging results that were available during my care of the patient were reviewed by me and considered in my medical decision making (see chart for details).    Patient is a 27 y.o. male who presents to ED for penile discharge x 2 days. He is afebrile without abdominal tenderness, abdominal pain or painful bowel movements to indicate prostatitis.  No tenderness to palpation of the testes or epididymis to suggest orchitis or epididymitis.  STD cultures obtained including syphilis, gonorrhea and chlamydia. Patient to be discharged with instructions to follow up with PCP. Discussed importance of using protection when sexually active. Patient understands that they have GC/Chlamydia cultures pending and that they will need to inform all sexual partners if results return positive. Patient has been treated prophylactically with azithromycin and Rocephin. Return precautions given and all questions answered.   Final Clinical Impressions(s) / ED Diagnoses   Final diagnoses:  Penile discharge    ED Discharge Orders    None       Ripken Rekowski, Chase Picket, PA-C 01/24/18 2300    Raeford Razor, MD 01/25/18 [phone]

## 2018-01-25 LAB — GC/CHLAMYDIA PROBE AMP (~~LOC~~) NOT AT ARMC
Chlamydia: NEGATIVE
Neisseria Gonorrhea: POSITIVE — AB

## 2018-01-26 LAB — RPR: RPR Ser Ql: REACTIVE — AB

## 2018-01-26 LAB — RPR, QUANT+TP ABS (REFLEX)
Rapid Plasma Reagin, Quant: 1:4 {titer} — ABNORMAL HIGH
T Pallidum Abs: POSITIVE — AB

## 2018-03-30 ENCOUNTER — Other Ambulatory Visit: Payer: Self-pay | Admitting: Internal Medicine

## 2018-03-30 DIAGNOSIS — B20 Human immunodeficiency virus [HIV] disease: Secondary | ICD-10-CM

## 2018-04-04 ENCOUNTER — Other Ambulatory Visit: Payer: Self-pay | Admitting: *Deleted

## 2018-04-04 ENCOUNTER — Other Ambulatory Visit: Payer: Self-pay

## 2018-04-04 ENCOUNTER — Ambulatory Visit: Payer: Self-pay

## 2018-04-04 DIAGNOSIS — Z113 Encounter for screening for infections with a predominantly sexual mode of transmission: Secondary | ICD-10-CM

## 2018-04-04 DIAGNOSIS — B20 Human immunodeficiency virus [HIV] disease: Secondary | ICD-10-CM

## 2018-04-04 DIAGNOSIS — Z79899 Other long term (current) drug therapy: Secondary | ICD-10-CM

## 2018-04-21 ENCOUNTER — Ambulatory Visit: Payer: Self-pay

## 2018-04-21 ENCOUNTER — Ambulatory Visit: Payer: Self-pay | Admitting: Family

## 2018-05-05 ENCOUNTER — Other Ambulatory Visit: Payer: Medicaid Other

## 2018-05-05 ENCOUNTER — Other Ambulatory Visit (HOSPITAL_COMMUNITY)
Admission: RE | Admit: 2018-05-05 | Discharge: 2018-05-05 | Disposition: A | Discharge status: Home / Self Care | Payer: Medicaid Other | Source: Ambulatory Visit | Attending: Family | Admitting: Family

## 2018-05-05 ENCOUNTER — Ambulatory Visit: Payer: Medicaid Other

## 2018-05-05 DIAGNOSIS — Z113 Encounter for screening for infections with a predominantly sexual mode of transmission: Secondary | ICD-10-CM

## 2018-05-05 DIAGNOSIS — B20 Human immunodeficiency virus [HIV] disease: Secondary | ICD-10-CM

## 2018-05-05 DIAGNOSIS — Z79899 Other long term (current) drug therapy: Secondary | ICD-10-CM

## 2018-05-05 LAB — URINALYSIS
Bilirubin Urine: NEGATIVE
Glucose, UA: NEGATIVE
HGB URINE DIPSTICK: NEGATIVE
KETONES UR: NEGATIVE
Leukocytes, UA: NEGATIVE
NITRITE: NEGATIVE
PROTEIN: NEGATIVE
SPECIFIC GRAVITY, URINE: 1.02 (ref 1.001–1.03)
pH: 8 (ref 5.0–8.0)

## 2018-05-05 LAB — T-HELPER CELL (CD4) - (RCID CLINIC ONLY)
CD4 T CELL HELPER: 40 % (ref 33–55)
CD4 T Cell Abs: 590 /uL (ref 400–2700)

## 2018-05-07 LAB — QUANTIFERON-TB GOLD PLUS
Mitogen-NIL: >10 IU/mL
NIL: 0.01 [IU]/mL
QUANTIFERON-TB GOLD PLUS: NEGATIVE
TB1-NIL: 0.01 IU/mL
TB2-NIL: 0.01 IU/mL

## 2018-05-08 LAB — URINE CYTOLOGY ANCILLARY ONLY
Chlamydia: NEGATIVE
Neisseria Gonorrhea: NEGATIVE

## 2018-05-09 LAB — HIV-1 RNA ULTRAQUANT REFLEX TO GENTYP+: HIV-1 RNA Quant, Log: <1.3 Log cps/mL

## 2018-05-10 LAB — COMPLETE METABOLIC PANEL WITH GFR
AG Ratio: 1.9 (calc) (ref 1.0–2.5)
ALKALINE PHOSPHATASE (APISO): 54 U/L (ref 40–115)
ALT: 11 U/L (ref 9–46)
AST: 15 U/L (ref 10–40)
Albumin: 4.6 g/dL (ref 3.6–5.1)
BUN: 15 mg/dL (ref 7–25)
CHLORIDE: 107 mmol/L (ref 98–110)
CO2: 25 mmol/L (ref 20–32)
CREATININE: 0.97 mg/dL (ref 0.60–1.35)
Calcium: 9.6 mg/dL (ref 8.6–10.3)
GFR, Est African American: 125 mL/min/{1.73_m2} (ref >=60)
GFR, Est Non African American: 108 mL/min/{1.73_m2} (ref >=60)
GLUCOSE: 93 mg/dL (ref 65–99)
Globulin: 2.4 g/dL (calc) (ref 1.9–3.7)
Potassium: 3.9 mmol/L (ref 3.5–5.3)
Sodium: 140 mmol/L (ref 135–146)
Total Bilirubin: 0.4 mg/dL (ref 0.2–1.2)
Total Protein: 7 g/dL (ref 6.1–8.1)

## 2018-05-10 LAB — CBC WITH DIFFERENTIAL/PLATELET
BASOS PCT: 0 %
Basophils Absolute: 0 cells/uL (ref 0–200)
EOS PCT: 1.9 %
Eosinophils Absolute: 49 cells/uL (ref 15–500)
HEMATOCRIT: 38.6 % (ref 38.5–50.0)
Hemoglobin: 13.6 g/dL (ref 13.2–17.1)
LYMPHS ABS: 1414 {cells}/uL (ref 850–3900)
MCH: 32.9 pg (ref 27.0–33.0)
MCHC: 35.2 g/dL (ref 32.0–36.0)
MCV: 93.5 fL (ref 80.0–100.0)
MPV: 9.4 fL (ref 7.5–12.5)
Monocytes Relative: 9.5 %
Neutro Abs: 889 cells/uL — ABNORMAL LOW (ref 1500–7800)
Neutrophils Relative %: 34.2 %
PLATELETS: 250 10*3/uL (ref 140–400)
RBC: 4.13 10*6/uL — AB (ref 4.20–5.80)
RDW: 12.2 % (ref 11.0–15.0)
Total Lymphocyte: 54.4 %
WBC: 2.6 10*3/uL — AB (ref 3.8–10.8)
WBCMIX: 247 {cells}/uL (ref 200–950)

## 2018-05-10 LAB — HEPATITIS B CORE ANTIBODY, TOTAL: Hep B Core Total Ab: NONREACTIVE

## 2018-05-10 LAB — HEPATITIS A ANTIBODY, TOTAL: Hepatitis A AB,Total: REACTIVE — AB

## 2018-05-10 LAB — LIPID PANEL
Cholesterol: 239 mg/dL — ABNORMAL HIGH (ref <200)
HDL: 55 mg/dL (ref >40)
LDL CHOLESTEROL (CALC): 167 mg/dL — AB
NON-HDL CHOLESTEROL (CALC): 184 mg/dL — AB (ref <130)
Total CHOL/HDL Ratio: 4.3 (calc) (ref <5.0)
Triglycerides: 72 mg/dL (ref <150)

## 2018-05-10 LAB — RPR TITER: RPR Titer: 1:16 {titer} — ABNORMAL HIGH

## 2018-05-10 LAB — HLA B*5701: HLA-B*5701 w/rflx HLA-B High: NEGATIVE

## 2018-05-10 LAB — HEPATITIS B SURFACE ANTIBODY,QUALITATIVE: HEP B S AB: REACTIVE — AB

## 2018-05-10 LAB — RPR: RPR Ser Ql: REACTIVE — AB

## 2018-05-10 LAB — HEPATITIS C ANTIBODY
Hepatitis C Ab: NONREACTIVE
SIGNAL TO CUT-OFF: 0.03 (ref <1.00)

## 2018-05-10 LAB — HIV-1/2 AB - DIFFERENTIATION
HIV 1 ANTIBODY: POSITIVE — AB
HIV-2 Ab: NEGATIVE

## 2018-05-10 LAB — FLUORESCENT TREPONEMAL AB(FTA)-IGG-BLD: Fluorescent Treponemal ABS: REACTIVE — AB

## 2018-05-10 LAB — HIV ANTIBODY (ROUTINE TESTING W REFLEX): HIV: REACTIVE — AB

## 2018-05-10 LAB — HEPATITIS B SURFACE ANTIGEN: HEP B S AG: NONREACTIVE

## 2018-05-19 ENCOUNTER — Ambulatory Visit: Payer: Medicaid Other

## 2018-05-19 ENCOUNTER — Ambulatory Visit: Payer: Medicaid Other | Admitting: Family

## 2018-05-22 ENCOUNTER — Encounter: Payer: Medicaid Other | Admitting: Family

## 2018-05-22 ENCOUNTER — Ambulatory Visit: Payer: Medicaid Other | Admitting: Family

## 2018-05-22 ENCOUNTER — Ambulatory Visit: Payer: Medicaid Other

## 2018-05-26 ENCOUNTER — Ambulatory Visit: Payer: Medicaid Other

## 2018-05-26 ENCOUNTER — Ambulatory Visit: Payer: Medicaid Other | Admitting: Family

## 2018-06-03 ENCOUNTER — Encounter (HOSPITAL_COMMUNITY): Payer: Self-pay

## 2018-06-03 ENCOUNTER — Emergency Department (HOSPITAL_COMMUNITY)
Admission: EM | Admit: 2018-06-03 | Discharge: 2018-06-03 | Disposition: A | Discharge status: Home / Self Care | Payer: No Typology Code available for payment source | Attending: Emergency Medicine | Admitting: Emergency Medicine

## 2018-06-03 DIAGNOSIS — B20 Human immunodeficiency virus [HIV] disease: Secondary | ICD-10-CM | POA: Diagnosis not present

## 2018-06-03 DIAGNOSIS — F17210 Nicotine dependence, cigarettes, uncomplicated: Secondary | ICD-10-CM | POA: Diagnosis not present

## 2018-06-03 DIAGNOSIS — M62830 Muscle spasm of back: Secondary | ICD-10-CM | POA: Diagnosis not present

## 2018-06-03 DIAGNOSIS — Z79899 Other long term (current) drug therapy: Secondary | ICD-10-CM | POA: Diagnosis not present

## 2018-06-03 DIAGNOSIS — M545 Low back pain: Secondary | ICD-10-CM | POA: Diagnosis present

## 2018-06-03 MED ORDER — CYCLOBENZAPRINE HCL 5 MG PO TABS: 5.0000 mg | ORAL_TABLET | Freq: Two times a day (BID) | ORAL | 0 refills | Status: DC | PRN

## 2018-06-03 NOTE — Discharge Instructions (Signed)
Do not take the muscle relaxer when driving as it will make you sleepy. You may take tylenol in addition to the muscle relaxer if needed.
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
## 2018-06-12 ENCOUNTER — Ambulatory Visit: Payer: Medicaid Other

## 2018-06-12 ENCOUNTER — Ambulatory Visit: Payer: Medicaid Other | Admitting: Family

## 2018-06-13 ENCOUNTER — Encounter: Payer: Self-pay | Admitting: Family

## 2018-06-13 ENCOUNTER — Ambulatory Visit (INDEPENDENT_AMBULATORY_CARE_PROVIDER_SITE_OTHER): Payer: Medicaid Other | Admitting: Family

## 2018-06-13 ENCOUNTER — Ambulatory Visit: Payer: Medicaid Other

## 2018-06-13 VITALS — BP 125/76 | HR 68 | Temp 98.1°F | Ht 67.0 in | Wt 127.0 lb

## 2018-06-13 DIAGNOSIS — Z0000 Encounter for general adult medical examination without abnormal findings: Secondary | ICD-10-CM

## 2018-06-13 DIAGNOSIS — B20 Human immunodeficiency virus [HIV] disease: Secondary | ICD-10-CM

## 2018-06-13 DIAGNOSIS — A528 Late syphilis, latent: Secondary | ICD-10-CM

## 2018-06-13 DIAGNOSIS — Z23 Encounter for immunization: Secondary | ICD-10-CM

## 2018-06-13 MED ORDER — ELVITEG-COBIC-EMTRICIT-TENOFAF 150-150-200-10 MG PO TABS: 1.0000 | ORAL_TABLET | Freq: Every day | ORAL | 5 refills | Status: DC

## 2018-06-13 MED ORDER — PENICILLIN G BENZATHINE 1200000 UNIT/2ML IM SUSP
1.2000 10*6.[IU] | INTRAMUSCULAR | Status: AC
  Administered 2018-06-13 – 2018-06-20 (×2): 1.2 10*6.[IU] via INTRAMUSCULAR

## 2018-06-13 NOTE — Progress Notes (Signed)
Subjective:    Patient ID: Patrick Glass, male    DOB: 1991/02/19, 27 y.o.   MRN: [id number]  Chief Complaint  Patient presents with  . HIV Positive/AIDS     HPI:  Patrick Glass is a 27 y.o. male who presents today for routine follow up of his HIV disease.   1.) HIV - Patrick Glass was last seen in our office on 03/22/17 at which time his medication regimen consisted of Genvoya. His most recent lab work completed on 05/05/18 showed a viral load that was undetectable and a CD4 count of 590. His RPR was once again positive and the titer increased from a 1:2 up to 1:16 with concern for new syphilis infection and will require 3 doses of Bicillin.   He has been adherent and missed the last 3 dosages secondary to running out of medication. No adverse side effects. Denies fevers, chills, night sweats, headaches, changes in vision, neck pain/stiffness, nausea, diarrhea, vomiting, lesions or rashes. No problems obtaining or taking his medication. Currently working as Programme researcher, broadcasting/film/video at [employer] and doing well.   2.) Health maintenance - Due for Prevnar today.   Immunization History  Administered Date(s) Administered  . DTaP 08/13/1992, 11/03/1992, 03/31/1994, 10/05/1994, 06/26/1996  . HPV Bivalent 06/25/2010  . HPV Quadrivalent 10/28/2015  . Hepatitis A 06/06/2006, 07/20/2007  . Hepatitis B 06/05/1992, 07/08/1992, 03/31/1994  . HiB (PRP-OMP) 08/13/1992, 11/03/1992, 03/31/1994  . IPV 08/13/1992, 11/03/1992, 06/26/1996  . Influenza,inj,Quad PF,6+ Mos 09/27/2013, 11/07/2014, 10/28/2015  . MMR 03/31/1994, 06/26/1996  . Meningococcal Polysaccharide 06/06/2006  . PPD Test 04/03/2013  . Pneumococcal Polysaccharide-23 04/03/2013  . Td 05/25/2004, 06/25/2010  . Tdap 06/25/2010      No Known Allergies    Outpatient Medications Prior to Visit  Medication Sig Dispense Refill  . elvitegravir-cobicistat-emtricitabine-tenofovir (GENVOYA) 150-150-200-10 MG TABS tablet Take 1 tablet by mouth  daily. 30 tablet 5  . GENVOYA 150-150-200-10 MG TABS tablet TAKE 1 TABLET BY MOUTH DAILY 30 tablet 5  . cyclobenzaprine (FLEXERIL) 5 MG tablet Take 1 tablet (5 mg total) by mouth 2 (two) times daily as needed for muscle spasms. (Patient not taking: Reported on 06/13/2018) 15 tablet 0   No facility-administered medications prior to visit.      Past Medical History:  Diagnosis Date  . HIV (human immunodeficiency virus infection) ([place])      Past Surgical History:  Procedure Laterality Date  . FINGER SURGERY         Review of Systems  Constitutional: Negative for activity change, appetite change, diaphoresis, fatigue, fever and unexpected weight change.  HENT: Negative for congestion, sinus pressure and sore throat.   Respiratory: Negative for cough, chest tightness, shortness of breath and wheezing.   Cardiovascular: Negative for chest pain and leg swelling.  Gastrointestinal: Negative for abdominal pain, constipation, diarrhea, nausea and vomiting.  Genitourinary: Negative for dysuria, flank pain, frequency, genital sores, hematuria and urgency.  Neurological: Negative for weakness and headaches.      Objective:    BP 125/76   Pulse 68   Temp 98.1 F (36.7 C) (Oral)   Ht _0  (1.702 m)   Wt 127 lb (57.6 kg)   BMI 19.89 kg/m  Nursing note and vital signs reviewed.  Physical Exam  Constitutional: He is oriented to person, place, and time. He appears well-developed. No distress.  HENT:  Mouth/Throat: Oropharynx is clear and moist.  Eyes: Conjunctivae are normal.  Neck: Neck supple.  Cardiovascular: Normal rate, regular rhythm,  normal heart sounds and intact distal pulses. Exam reveals no gallop and no friction rub.  No murmur heard. Pulmonary/Chest: Effort normal and breath sounds normal. No respiratory distress. He has no wheezes. He has no rales. He exhibits no tenderness.  Abdominal: Soft. Bowel sounds are normal. There is no tenderness.  Lymphadenopathy:    He has  no cervical adenopathy.  Neurological: He is alert and oriented to person, place, and time.  Skin: Skin is warm and dry. No rash noted.  Psychiatric: He has a normal mood and affect. His behavior is normal. Judgment and thought content normal.       Assessment & Plan:   Problem List Items Addressed This Visit      Other   Human immunodeficiency virus (HIV) disease ([place]) - Primary    Patrick. Glass continues to be adherent to his medication regimen of Genvoya with 3 missed doses secondary to running out of medication. His viral load remains undetectable and his CD4 count is 590. He has no symptoms/signs of opportunistic infection through history or physical exam. Prevnar updated today. Will need Pneumovax in about 8 weeks or at next office visit. Declines condoms. Continue current dosage of Genvoya with follow up in 6 months or sooner if needed.       Relevant Medications   elvitegravir-cobicistat-emtricitabine-tenofovir (GENVOYA) 150-150-200-10 MG TABS tablet   Health care maintenance    Updated Prevnar today. Will need additional dose of Pneumovax in 8 weeks.       Late latent syphilis    Patrick. Glass blood work showed a 2 dilution increase in a previous titer from 1:4 now to 1:6 indicating either new infection or incomplete treatment. He has late latent syphilis and plan to treat with 3 weekly injections of Bicillin. Repeat RPR/titer in 6 months.  Advised to have any partners tested and treated if needed. Safe sexual practice discussed.       Relevant Medications   penicillin g benzathine (BICILLIN LA) 1200000 UNIT/2ML injection 1.2 Million Units (Start on 06/13/2018  3:15 PM)   penicillin g benzathine (BICILLIN LA) 1200000 UNIT/2ML injection 1.2 Million Units (Start on 06/13/2018  3:15 PM)   elvitegravir-cobicistat-emtricitabine-tenofovir (GENVOYA) 150-150-200-10 MG TABS tablet       I have discontinued Patrick Glass's GENVOYA. I am also having him maintain his cyclobenzaprine and  elvitegravir-cobicistat-emtricitabine-tenofovir. We will continue to administer penicillin g benzathine and penicillin g benzathine.   Meds ordered this encounter  Medications  . penicillin g benzathine (BICILLIN LA) 1200000 UNIT/2ML injection 1.2 Million Units  . penicillin g benzathine (BICILLIN LA) 1200000 UNIT/2ML injection 1.2 Million Units  . DISCONTD: elvitegravir-cobicistat-emtricitabine-tenofovir (GENVOYA) 150-150-200-10 MG TABS tablet    Sig: Take 1 tablet by mouth daily.    Dispense:  30 tablet    Refill:  5    Order Specific Question:   Supervising Provider    Answer:   Carlyle Basques [[id number]]  . elvitegravir-cobicistat-emtricitabine-tenofovir (GENVOYA) 150-150-200-10 MG TABS tablet    Sig: Take 1 tablet by mouth daily.    Dispense:  30 tablet    Refill:  5    Order Specific Question:   Supervising Provider    Answer:   Carlyle Basques [[id number]]     Follow-up: Return in about 6 months (around 12/14/2018), or if symptoms worsen or fail to improve.   Terri Piedra, MSN, [facility]

## 2018-06-13 NOTE — Assessment & Plan Note (Signed)
Updated Prevnar today. Will need additional dose of Pneumovax in 8 weeks.

## 2018-06-13 NOTE — Patient Instructions (Signed)
Very nice to meet you!  You are doing a great job with your medication.  Please schedule appointments for the next 2 weeks for nurse visits to receive your injections.   Recommend you inform your partner to be tested and treated if needed.   Please continue to take your medication as prescribed.  Plan to follow up in 6 months or sooner with blood work about 1-2 weeks prior to.   Let us know if you have any questions!

## 2018-06-13 NOTE — Assessment & Plan Note (Signed)
Mr. Patrick Glass's blood work showed a 2 dilution increase in a previous titer from 1:4 now to 1:6 indicating either new infection or incomplete treatment. He has late latent syphilis and plan to treat with 3 weekly injections of Bicillin. Repeat RPR/titer in 6 months.  Advised to have any partners tested and treated if needed. Safe sexual practice discussed.

## 2018-06-13 NOTE — Assessment & Plan Note (Signed)
Patrick Glass continues to be adherent to his medication regimen of Genvoya with 3 missed doses secondary to running out of medication. His viral load remains undetectable and his CD4 count is 590. He has no symptoms/signs of opportunistic infection through history or physical exam. Prevnar updated today. Will need Pneumovax in about 8 weeks or at next office visit. Declines condoms. Continue current dosage of Genvoya with follow up in 6 months or sooner if needed.

## 2018-06-20 ENCOUNTER — Ambulatory Visit: Payer: Self-pay

## 2018-06-20 ENCOUNTER — Ambulatory Visit (INDEPENDENT_AMBULATORY_CARE_PROVIDER_SITE_OTHER): Payer: Self-pay | Admitting: *Deleted

## 2018-06-20 DIAGNOSIS — A64 Unspecified sexually transmitted disease: Secondary | ICD-10-CM

## 2018-06-20 DIAGNOSIS — A528 Late syphilis, latent: Secondary | ICD-10-CM

## 2018-06-20 MED ORDER — PENICILLIN G BENZATHINE 1200000 UNIT/2ML IM SUSP: 1.2000 10*6.[IU] | Freq: Once | INTRAMUSCULAR | Status: DC

## 2018-06-27 ENCOUNTER — Encounter: Payer: Self-pay | Admitting: Family

## 2018-06-27 ENCOUNTER — Ambulatory Visit: Payer: Medicaid Other

## 2018-12-13 ENCOUNTER — Other Ambulatory Visit: Payer: Self-pay | Admitting: *Deleted

## 2018-12-13 DIAGNOSIS — B20 Human immunodeficiency virus [HIV] disease: Secondary | ICD-10-CM

## 2018-12-13 DIAGNOSIS — Z113 Encounter for screening for infections with a predominantly sexual mode of transmission: Secondary | ICD-10-CM

## 2018-12-14 ENCOUNTER — Other Ambulatory Visit: Payer: Medicaid Other

## 2018-12-15 ENCOUNTER — Other Ambulatory Visit: Payer: Medicaid Other

## 2018-12-15 DIAGNOSIS — Z113 Encounter for screening for infections with a predominantly sexual mode of transmission: Secondary | ICD-10-CM

## 2018-12-15 DIAGNOSIS — B20 Human immunodeficiency virus [HIV] disease: Secondary | ICD-10-CM

## 2018-12-15 LAB — T-HELPER CELL (CD4) - (RCID CLINIC ONLY)
CD4 % Helper T Cell: 46 % (ref 33–55)
CD4 T CELL ABS: 610 /uL (ref 400–2700)

## 2018-12-18 LAB — CBC WITH DIFFERENTIAL/PLATELET
Absolute Monocytes: 237 cells/uL (ref 200–950)
Basophils Absolute: 0 cells/uL (ref 0–200)
Basophils Relative: 0 %
Eosinophils Absolute: 49 cells/uL (ref 15–500)
Eosinophils Relative: 1.9 %
HCT: 41.6 % (ref 38.5–50.0)
Hemoglobin: 14.6 g/dL (ref 13.2–17.1)
Lymphs Abs: 1334 cells/uL (ref 850–3900)
MCH: 32.7 pg (ref 27.0–33.0)
MCHC: 35.1 g/dL (ref 32.0–36.0)
MCV: 93.3 fL (ref 80.0–100.0)
MPV: 9.7 fL (ref 7.5–12.5)
Monocytes Relative: 9.1 %
Neutro Abs: 980 cells/uL — ABNORMAL LOW (ref 1500–7800)
Neutrophils Relative %: 37.7 %
PLATELETS: 272 10*3/uL (ref 140–400)
RBC: 4.46 10*6/uL (ref 4.20–5.80)
RDW: 12.3 % (ref 11.0–15.0)
Total Lymphocyte: 51.3 %
WBC: 2.6 10*3/uL — ABNORMAL LOW (ref 3.8–10.8)

## 2018-12-18 LAB — COMPLETE METABOLIC PANEL WITH GFR
AG Ratio: 1.6 (calc) (ref 1.0–2.5)
ALT: 9 U/L (ref 9–46)
AST: 15 U/L (ref 10–40)
Albumin: 4.4 g/dL (ref 3.6–5.1)
Alkaline phosphatase (APISO): 55 U/L (ref 40–115)
BUN: 18 mg/dL (ref 7–25)
CALCIUM: 9.8 mg/dL (ref 8.6–10.3)
CO2: 29 mmol/L (ref 20–32)
Chloride: 105 mmol/L (ref 98–110)
Creat: 1.04 mg/dL (ref 0.60–1.35)
GFR, EST NON AFRICAN AMERICAN: 99 mL/min/{1.73_m2} (ref >=60)
GFR, Est African American: 114 mL/min/{1.73_m2} (ref >=60)
Globulin: 2.7 g/dL (calc) (ref 1.9–3.7)
Glucose, Bld: 90 mg/dL (ref 65–99)
Potassium: 4.4 mmol/L (ref 3.5–5.3)
Sodium: 139 mmol/L (ref 135–146)
Total Bilirubin: 0.4 mg/dL (ref 0.2–1.2)
Total Protein: 7.1 g/dL (ref 6.1–8.1)

## 2018-12-18 LAB — HIV-1 RNA QUANT-NO REFLEX-BLD
HIV 1 RNA Quant: <20 copies/mL
HIV-1 RNA Quant, Log: <1.3 Log copies/mL

## 2018-12-18 LAB — RPR: RPR Ser Ql: REACTIVE — AB

## 2018-12-18 LAB — FLUORESCENT TREPONEMAL AB(FTA)-IGG-BLD: Fluorescent Treponemal ABS: REACTIVE — AB

## 2018-12-18 LAB — RPR TITER: RPR Titer: 1:4 {titer} — ABNORMAL HIGH

## 2018-12-28 ENCOUNTER — Encounter: Payer: Medicaid Other | Admitting: Family

## 2019-01-05 ENCOUNTER — Ambulatory Visit (INDEPENDENT_AMBULATORY_CARE_PROVIDER_SITE_OTHER): Payer: Self-pay | Admitting: Family

## 2019-01-05 ENCOUNTER — Encounter: Payer: Self-pay | Admitting: Family

## 2019-01-05 VITALS — BP 113/67 | HR 66 | Temp 98.5°F | Ht 67.0 in | Wt 119.8 lb

## 2019-01-05 DIAGNOSIS — Z0000 Encounter for general adult medical examination without abnormal findings: Secondary | ICD-10-CM

## 2019-01-05 DIAGNOSIS — Z79899 Other long term (current) drug therapy: Secondary | ICD-10-CM

## 2019-01-05 DIAGNOSIS — B20 Human immunodeficiency virus [HIV] disease: Secondary | ICD-10-CM

## 2019-01-05 MED ORDER — ELVITEG-COBIC-EMTRICIT-TENOFAF 150-150-200-10 MG PO TABS: 1.0000 | ORAL_TABLET | Freq: Every day | ORAL | 5 refills | Status: DC

## 2019-01-05 NOTE — Assessment & Plan Note (Signed)
Patrick Glass has well controlled HIV disease with good adherence and tolerance to his regimen of Genvoya. He has no signs/symptoms of opportunistic infection or progressive HIV disease. Continue current dose of Genvoya. Follow up office visit in 6 months or sooner if needed with lab work 1-2 weeks prior to appointment.

## 2019-01-05 NOTE — Patient Instructions (Signed)
Nice to see you.  We will check you for STI today.  Continue to take your Genvoya as prescribed daily.  Plan for office follow up in 6 months or sooner if needed with lab work 1-2 weeks prior to appointment.

## 2019-01-05 NOTE — Assessment & Plan Note (Signed)
   Referral placed to [facility] clinic for routine dental care.  Discussed importance of safe sexual practice to reduce risk of acquisition or transmission of STI. Declines condoms.   STI testing completed.

## 2019-01-05 NOTE — Progress Notes (Signed)
Subjective:    Patient ID: Patrick Glass, male    DOB: Oct 18, 1991, 28 y.o.   MRN: [id number]  Chief Complaint  Patient presents with  . HIV Positive/AIDS     HPI:  Patrick Glass is a 28 y.o. male who presents today for routine follow up of HIV disease.   Mr. Glass was last seen in the office on 06/13/18 for routine follow up with adherence and tolerance of his ART regimen of Genvoya. Viral load was undetectable at the time with a CD4 count of 590. He was also treated for Syphilis with a titer of 1:16. Most recent blood work completed on 1/10/320 shows a CD4 count of 610 with a viral load that remains undetectable. RPR titer improved to 1:4 consistent with successful syphilis treatment.   Mr. Glass has been taking his Genvoya as prescribed with no adverse side effects or missed doses since his last office visit. He remains covered through [employer] and receives his medications from [facility]. Continues to work at [employer] with stable housing and no problems obtaining food. He remains sexually active at present. Due for a dental screening.    No Known Allergies    Outpatient Medications Prior to Visit  Medication Sig Dispense Refill  . elvitegravir-cobicistat-emtricitabine-tenofovir (GENVOYA) 150-150-200-10 MG TABS tablet Take 1 tablet by mouth daily. 30 tablet 5  . cyclobenzaprine (FLEXERIL) 5 MG tablet Take 1 tablet (5 mg total) by mouth 2 (two) times daily as needed for muscle spasms. (Patient not taking: Reported on 01/05/2019) 15 tablet 0   No facility-administered medications prior to visit.      Past Medical History:  Diagnosis Date  . HIV (human immunodeficiency virus infection) (HCC)      Past Surgical History:  Procedure Laterality Date  . FINGER SURGERY         Review of Systems  Constitutional: Negative for appetite change, chills, fatigue, fever and unexpected weight change.  Eyes: Negative for visual disturbance.  Respiratory: Negative for cough,  chest tightness, shortness of breath and wheezing.   Cardiovascular: Negative for chest pain and leg swelling.  Gastrointestinal: Negative for abdominal pain, constipation, diarrhea, nausea and vomiting.  Genitourinary: Negative for dysuria, flank pain, frequency, genital sores, hematuria and urgency.  Skin: Negative for rash.  Allergic/Immunologic: Negative for immunocompromised state.  Neurological: Negative for dizziness and headaches.      Objective:    BP 113/67   Pulse 66   Temp 98.5 F (36.9 C) (Oral)   Ht 5\' 7"  (1.702 m)   Wt 119 lb 12 oz (54.3 kg)   BMI 18.76 kg/m  Nursing note and vital signs reviewed.  Physical Exam Constitutional:      General: He is not in acute distress.    Appearance: He is well-developed.  Eyes:     Conjunctiva/sclera: Conjunctivae normal.  Neck:     Musculoskeletal: Neck supple.  Cardiovascular:     Rate and Rhythm: Normal rate and regular rhythm.     Heart sounds: Normal heart sounds. No murmur. No friction rub. No gallop.   Pulmonary:     Effort: Pulmonary effort is normal. No respiratory distress.     Breath sounds: Normal breath sounds. No wheezing or rales.  Chest:     Chest wall: No tenderness.  Abdominal:     General: Bowel sounds are normal.     Palpations: Abdomen is soft.     Tenderness: There is no abdominal tenderness.  Lymphadenopathy:  Cervical: No cervical adenopathy.  Skin:    General: Skin is warm and dry.     Findings: No rash.  Neurological:     Mental Status: He is alert and oriented to person, place, and time.  Psychiatric:        Behavior: Behavior normal.        Thought Content: Thought content normal.        Judgment: Judgment normal.        Assessment & Plan:   Problem List Items Addressed This Visit      Other   Human immunodeficiency virus (HIV) disease (HCC) - Primary    Mr. [place] has well controlled HIV disease with good adherence and tolerance to his regimen of Genvoya. He has no  signs/symptoms of opportunistic infection or progressive HIV disease. Continue current dose of Genvoya. Follow up office visit in 6 months or sooner if needed with lab work 1-2 weeks prior to appointment.       Relevant Medications   elvitegravir-cobicistat-emtricitabine-tenofovir (GENVOYA) 150-150-200-10 MG TABS tablet   Other Relevant Orders   Cytology (oral, anal, urethral) ancillary only   Cytology (oral, anal, urethral) ancillary only   HIV-1 RNA quant-no reflex-bld   T-helper cell (CD4)- ([facility] clinic only)   CBC   Comprehensive metabolic panel   RPR   Lipid panel   Urine cytology ancillary only([place])   Health care maintenance     [facility].  Discussed importance of safe sexual practice to reduce risk of acquisition or transmission of STI. Declines condoms.   STI testing completed.           I am having Maxey Q. [place] maintain his cyclobenzaprine and elvitegravir-cobicistat-emtricitabine-tenofovir.   Meds ordered this encounter  Medications  . elvitegravir-cobicistat-emtricitabine-tenofovir (GENVOYA) 150-150-200-10 MG TABS tablet    Sig: Take 1 tablet by mouth daily.    Dispense:  30 tablet    Refill:  5    Order Specific Question:   Supervising Provider    Answer:   Judyann MunsonSNIDER, CYNTHIA [[id number]]     Follow-up: Return in about 6 months (around 07/06/2019), or if symptoms worsen or fail to improve.   Marcos EkeGreg Sophy Mesler, MSN, FNP-C Nurse Practitioner [facility] Office phone: [phone] Pager: [phone] [facility] Main number: [phone]

## 2019-01-08 LAB — CYTOLOGY, (ORAL, ANAL, URETHRAL) ANCILLARY ONLY
Chlamydia: NEGATIVE
Chlamydia: NEGATIVE
Neisseria Gonorrhea: NEGATIVE
Neisseria Gonorrhea: NEGATIVE

## 2019-01-09 ENCOUNTER — Telehealth: Payer: Self-pay

## 2019-01-09 ENCOUNTER — Encounter: Payer: Self-pay | Admitting: Family

## 2019-01-09 NOTE — Telephone Encounter (Signed)
Called patient to inform him of lab result. Patient was able to take my call; identification was verified with DOB. Informed patient that his STI lab work came back negative. Patient did not have any question regarding lab work. Will follow-up as discussed in last visit.  Lorenso Courier, [place]

## 2019-01-09 NOTE — Telephone Encounter (Signed)
-----   Message from Veryl Speak, FNP sent at 01/09/2019 11:27 AM EST ----- Please inform Mr. [place] that he was negative for STI.

## 2019-06-28 ENCOUNTER — Other Ambulatory Visit: Payer: Self-pay

## 2019-06-28 ENCOUNTER — Other Ambulatory Visit (HOSPITAL_COMMUNITY)
Admission: RE | Admit: 2019-06-28 | Discharge: 2019-06-28 | Disposition: A | Discharge status: Home / Self Care | Payer: Medicaid Other | Source: Ambulatory Visit | Attending: Family | Admitting: Family

## 2019-06-28 ENCOUNTER — Other Ambulatory Visit: Payer: Medicaid Other

## 2019-06-28 DIAGNOSIS — B20 Human immunodeficiency virus [HIV] disease: Secondary | ICD-10-CM

## 2019-06-29 ENCOUNTER — Other Ambulatory Visit: Payer: Medicaid Other

## 2019-06-29 LAB — URINE CYTOLOGY ANCILLARY ONLY
Chlamydia: NEGATIVE
Neisseria Gonorrhea: NEGATIVE

## 2019-06-29 LAB — T-HELPER CELL (CD4) - (RCID CLINIC ONLY)
CD4 % Helper T Cell: 48 % (ref 33–65)
CD4 T Cell Abs: 757 /uL (ref 400–1790)

## 2019-07-02 LAB — LIPID PANEL
Cholesterol: 216 mg/dL — ABNORMAL HIGH (ref <200)
HDL: 46 mg/dL (ref >=40)
LDL Cholesterol (Calc): 147 mg/dL (calc) — ABNORMAL HIGH
Non-HDL Cholesterol (Calc): 170 mg/dL (calc) — ABNORMAL HIGH (ref <130)
Total CHOL/HDL Ratio: 4.7 (calc) (ref <5.0)
Triglycerides: 111 mg/dL (ref <150)

## 2019-07-02 LAB — COMPREHENSIVE METABOLIC PANEL
AG Ratio: 1.8 (calc) (ref 1.0–2.5)
ALT: 10 U/L (ref 9–46)
AST: 14 U/L (ref 10–40)
Albumin: 4.4 g/dL (ref 3.6–5.1)
Alkaline phosphatase (APISO): 62 U/L (ref 36–130)
BUN: 14 mg/dL (ref 7–25)
CO2: 26 mmol/L (ref 20–32)
Calcium: 9.5 mg/dL (ref 8.6–10.3)
Chloride: 105 mmol/L (ref 98–110)
Creat: 1.01 mg/dL (ref 0.60–1.35)
Globulin: 2.4 g/dL (calc) (ref 1.9–3.7)
Glucose, Bld: 85 mg/dL (ref 65–99)
Potassium: 4 mmol/L (ref 3.5–5.3)
Sodium: 140 mmol/L (ref 135–146)
Total Bilirubin: 0.3 mg/dL (ref 0.2–1.2)
Total Protein: 6.8 g/dL (ref 6.1–8.1)

## 2019-07-02 LAB — FLUORESCENT TREPONEMAL AB(FTA)-IGG-BLD: Fluorescent Treponemal ABS: REACTIVE — AB

## 2019-07-02 LAB — CBC
HCT: 41.9 % (ref 38.5–50.0)
Hemoglobin: 14.1 g/dL (ref 13.2–17.1)
MCH: 32 pg (ref 27.0–33.0)
MCHC: 33.7 g/dL (ref 32.0–36.0)
MCV: 95 fL (ref 80.0–100.0)
MPV: 10 fL (ref 7.5–12.5)
Platelets: 265 10*3/uL (ref 140–400)
RBC: 4.41 10*6/uL (ref 4.20–5.80)
RDW: 12.8 % (ref 11.0–15.0)
WBC: 3.9 10*3/uL (ref 3.8–10.8)

## 2019-07-02 LAB — HIV-1 RNA QUANT-NO REFLEX-BLD
HIV 1 RNA Quant: <20 copies/mL
HIV-1 RNA Quant, Log: <1.3 Log copies/mL

## 2019-07-02 LAB — RPR TITER: RPR Titer: 1:4 {titer} — ABNORMAL HIGH

## 2019-07-02 LAB — RPR: RPR Ser Ql: REACTIVE — AB

## 2019-07-13 ENCOUNTER — Encounter: Payer: Medicaid Other | Admitting: Family

## 2019-07-19 ENCOUNTER — Encounter: Payer: Self-pay | Admitting: Family

## 2019-07-19 ENCOUNTER — Other Ambulatory Visit: Payer: Self-pay

## 2019-07-19 ENCOUNTER — Ambulatory Visit: Payer: Self-pay

## 2019-07-19 ENCOUNTER — Ambulatory Visit (INDEPENDENT_AMBULATORY_CARE_PROVIDER_SITE_OTHER): Payer: Self-pay | Admitting: Family

## 2019-07-19 VITALS — BP 124/71 | HR 71 | Temp 98.6°F

## 2019-07-19 DIAGNOSIS — Z113 Encounter for screening for infections with a predominantly sexual mode of transmission: Secondary | ICD-10-CM

## 2019-07-19 DIAGNOSIS — Z0000 Encounter for general adult medical examination without abnormal findings: Secondary | ICD-10-CM

## 2019-07-19 DIAGNOSIS — B20 Human immunodeficiency virus [HIV] disease: Secondary | ICD-10-CM

## 2019-07-19 MED ORDER — GENVOYA 150-150-200-10 MG PO TABS: 1.0000 | ORAL_TABLET | Freq: Every day | ORAL | 5 refills | Status: DC

## 2019-07-19 NOTE — Assessment & Plan Note (Signed)
   Discussed importance of safe sexual practice to reduce risk of acquisition/transmission of STI.  Condoms provided per request.  Awaiting dental screening which has been delayed due to coronavirus pandemic.

## 2019-07-19 NOTE — Assessment & Plan Note (Signed)
Patrick Glass has well-controlled HIV disease with good adherence and tolerance to his ART regimen of Genvoya.  He has no signs/symptoms of opportunistic infection or progressive HIV disease.  He has no problems obtaining his medication from the pharmacy.  He will renew financial assistance today and informed/reminded he will need to renew every 6 months following.  Continue current dose of Genvoya.  Plan for follow-up in 6 months or sooner if needed with lab work 1 to 2 weeks prior to appointment.

## 2019-07-19 NOTE — Progress Notes (Signed)
Subjective:    Patient ID: Patrick Glass, male    DOB: February 11, 1991, 28 y.o.   MRN: [id number]  Chief Complaint  Patient presents with  . HIV Positive/AIDS     HPI:  Trey Q Glass is a 28 y.o. male with HIV disease who was last seen in the office on 01/05/2019 with good adherence and tolerance to his ART regimen of Genvoya.  Blood work at the time showed a viral load that was undetectable and CD4 count of 610.  RPR had improved to 1: 4 indicating successful treatment of syphilis.  Most recent blood work completed on 06/28/2019 with viral load that remains undetectable and CD4 count of 757.  Kidney function, liver function, electrolytes within normal ranges.  Cholesterol remains slightly elevated with LDL of 147, triglycerides 086111, and HDL of 46.  Mr. Glass continues to take his Genvoya as prescribed no adverse side effects or missed doses.  Overall feeling well today with no new concerns/complaints.Denies fevers, chills, night sweats, headaches, changes in vision, neck pain/stiffness, nausea, diarrhea, vomiting, lesions or rashes.  [facility]. Glass [facility] and has no problems obtaining his medication from the pharmacy.  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  He remains sexually active using condoms with no new partners.  Denies feelings of being down, depressed, or hopeless recently.  No recreational or illicit drug use, tobacco use, or alcohol consumption.   No Known Allergies    Outpatient Medications Prior to Visit  Medication Sig Dispense Refill  . elvitegravir-cobicistat-emtricitabine-tenofovir (GENVOYA) 150-150-200-10 MG TABS tablet Take 1 tablet by mouth daily. 30 tablet 5  . cyclobenzaprine (FLEXERIL) 5 MG tablet Take 1 tablet (5 mg total) by mouth 2 (two) times daily as needed for muscle spasms. (Patient not taking: Reported on 01/05/2019) 15 tablet 0   No facility-administered medications prior to visit.      Past Medical History:  Diagnosis Date  . HIV  (human immunodeficiency virus infection) (HCC)      Past Surgical History:  Procedure Laterality Date  . FINGER SURGERY         Review of Systems  Constitutional: Negative for appetite change, chills, fatigue, fever and unexpected weight change.  Eyes: Negative for visual disturbance.  Respiratory: Negative for cough, chest tightness, shortness of breath and wheezing.   Cardiovascular: Negative for chest pain and leg swelling.  Gastrointestinal: Negative for abdominal pain, constipation, diarrhea, nausea and vomiting.  Genitourinary: Negative for dysuria, flank pain, frequency, genital sores, hematuria and urgency.  Skin: Negative for rash.  Allergic/Immunologic: Negative for immunocompromised state.  Neurological: Negative for dizziness and headaches.      Objective:    BP 124/71   Pulse 71   Temp 98.6 F (37 C) (Oral)  Nursing note and vital signs reviewed.  Physical Exam Constitutional:      General: He is not in acute distress.    Appearance: He is well-developed.  Eyes:     Conjunctiva/sclera: Conjunctivae normal.  Neck:     Musculoskeletal: Neck supple.  Cardiovascular:     Rate and Rhythm: Normal rate and regular rhythm.     Heart sounds: Normal heart sounds. No murmur. No friction rub. No gallop.   Pulmonary:     Effort: Pulmonary effort is normal. No respiratory distress.     Breath sounds: Normal breath sounds. No wheezing or rales.  Chest:     Chest wall: No tenderness.  Abdominal:     General: Bowel sounds are normal.  Palpations: Abdomen is soft.     Tenderness: There is no abdominal tenderness.  Lymphadenopathy:     Cervical: No cervical adenopathy.  Skin:    General: Skin is warm and dry.     Findings: No rash.  Neurological:     Mental Status: He is alert and oriented to person, place, and time.  Psychiatric:        Behavior: Behavior normal.        Thought Content: Thought content normal.        Judgment: Judgment normal.       Depression screen [facility] 2/9 07/19/2019 01/05/2019 06/13/2018 03/22/2017 03/09/2016  Decreased Interest 0 0 0 0 0  Down, Depressed, Hopeless 0 0 0 0 0  PHQ - 2 Score 0 0 0 0 0       Assessment & Plan:   Problem List Items Addressed This Visit      Other   Human immunodeficiency virus (HIV) disease ([place])    Mr. [place] has well-controlled HIV disease with good adherence and tolerance to his ART regimen of Genvoya.  He has no signs/symptoms of opportunistic infection or progressive HIV disease.  He has no problems obtaining his medication from the pharmacy.  He will renew financial assistance today and informed/reminded he will need to renew every 6 months following.  Continue current dose of Genvoya.  Plan for follow-up in 6 months or sooner if needed with lab work 1 to 2 weeks prior to appointment.      Relevant Medications   elvitegravir-cobicistat-emtricitabine-tenofovir (GENVOYA) 150-150-200-10 MG TABS tablet   Other Relevant Orders   T-helper cell (CD4)- ([facility] clinic only)   HIV-1 RNA quant-no reflex-bld   CBC   Comprehensive metabolic panel   Health care maintenance - Primary     Discussed importance of safe sexual practice to reduce risk of acquisition/transmission of STI.  Condoms provided per request.  Awaiting dental screening which has been delayed due to coronavirus pandemic.       Other Visit Diagnoses    Screening for STDs (sexually transmitted diseases)       Relevant Orders   RPR       I am having Sim Q. [place] maintain his cyclobenzaprine and Genvoya.   Meds ordered this encounter  Medications  . elvitegravir-cobicistat-emtricitabine-tenofovir (GENVOYA) 150-150-200-10 MG TABS tablet    Sig: Take 1 tablet by mouth daily.    Dispense:  30 tablet    Refill:  5    Order Specific Question:   Supervising Provider    Answer:   Carlyle Basques [[id number]]     Follow-up: Return in about 6 months (around 01/19/2020), or if symptoms worsen or fail to improve.   Terri Piedra, MSN, FNP-C Nurse Practitioner [facility] for Infectious Disease [place] number: [phone]

## 2019-07-19 NOTE — Patient Instructions (Signed)
Nice to see you!  Please continue take your Genvoya as prescribed daily.  Refills have been sent to the pharmacy.  Be sure to renew your financial assistance after January 1  Recommend influenza vaccination in September  Plan for follow-up in 6 months or sooner if needed with lab work 1 to 2 weeks prior to appointment.  Have a great day and stay safe!

## 2020-05-27 ENCOUNTER — Ambulatory Visit: Payer: Medicaid Other

## 2020-05-27 ENCOUNTER — Other Ambulatory Visit: Payer: Medicaid Other

## 2020-06-27 ENCOUNTER — Encounter: Payer: Medicaid Other | Admitting: Family

## 2020-07-02 ENCOUNTER — Other Ambulatory Visit: Payer: Medicaid Other

## 2020-07-08 ENCOUNTER — Other Ambulatory Visit: Payer: Self-pay

## 2020-07-08 ENCOUNTER — Encounter: Payer: Self-pay | Admitting: Family

## 2020-07-08 DIAGNOSIS — B20 Human immunodeficiency virus [HIV] disease: Secondary | ICD-10-CM

## 2020-07-08 DIAGNOSIS — Z113 Encounter for screening for infections with a predominantly sexual mode of transmission: Secondary | ICD-10-CM

## 2020-07-09 LAB — T-HELPER CELL (CD4) - (RCID CLINIC ONLY)
CD4 % Helper T Cell: 41 % (ref 33–65)
CD4 T Cell Abs: 488 /uL (ref 400–1790)

## 2020-07-11 LAB — COMPREHENSIVE METABOLIC PANEL
AG Ratio: 1.8 (calc) (ref 1.0–2.5)
ALT: 21 U/L (ref 9–46)
AST: 21 U/L (ref 10–40)
Albumin: 4.5 g/dL (ref 3.6–5.1)
Alkaline phosphatase (APISO): 67 U/L (ref 36–130)
BUN: 13 mg/dL (ref 7–25)
CO2: 27 mmol/L (ref 20–32)
Calcium: 9.3 mg/dL (ref 8.6–10.3)
Chloride: 105 mmol/L (ref 98–110)
Creat: 0.86 mg/dL (ref 0.60–1.35)
Globulin: 2.5 g/dL (calc) (ref 1.9–3.7)
Glucose, Bld: 92 mg/dL (ref 65–99)
Potassium: 3.9 mmol/L (ref 3.5–5.3)
Sodium: 139 mmol/L (ref 135–146)
Total Bilirubin: 0.4 mg/dL (ref 0.2–1.2)
Total Protein: 7 g/dL (ref 6.1–8.1)

## 2020-07-11 LAB — FLUORESCENT TREPONEMAL AB(FTA)-IGG-BLD: Fluorescent Treponemal ABS: REACTIVE — AB

## 2020-07-11 LAB — CBC
HCT: 41.1 % (ref 38.5–50.0)
Hemoglobin: 14 g/dL (ref 13.2–17.1)
MCH: 32 pg (ref 27.0–33.0)
MCHC: 34.1 g/dL (ref 32.0–36.0)
MCV: 93.8 fL (ref 80.0–100.0)
MPV: 9.9 fL (ref 7.5–12.5)
Platelets: 222 10*3/uL (ref 140–400)
RBC: 4.38 10*6/uL (ref 4.20–5.80)
RDW: 11.8 % (ref 11.0–15.0)
WBC: 3.1 10*3/uL — ABNORMAL LOW (ref 3.8–10.8)

## 2020-07-11 LAB — RPR TITER: RPR Titer: 1:4 {titer} — ABNORMAL HIGH

## 2020-07-11 LAB — HIV-1 RNA QUANT-NO REFLEX-BLD
HIV 1 RNA Quant: 26100 Copies/mL — ABNORMAL HIGH
HIV-1 RNA Quant, Log: 4.42 Log cps/mL — ABNORMAL HIGH

## 2020-07-11 LAB — RPR: RPR Ser Ql: REACTIVE — AB

## 2020-07-17 ENCOUNTER — Telehealth: Payer: Self-pay

## 2020-07-17 NOTE — Telephone Encounter (Signed)
COVID-19 Pre-Screening Questions:07/17/20  Do you currently have a fever (>100 F), chills or unexplained body aches? NO  Are you currently experiencing new cough, shortness of breath, sore throat, runny nose? NO .  Have you been in contact with someone that is currently pending confirmation of Covid19 testing or has been confirmed to have the Covid19 virus?  NO  **If the patient answers NO to ALL questions -  advise the patient to please call the clinic before coming to the office should any symptoms develop.     

## 2020-07-18 ENCOUNTER — Ambulatory Visit (INDEPENDENT_AMBULATORY_CARE_PROVIDER_SITE_OTHER): Payer: Self-pay | Admitting: Family

## 2020-07-18 ENCOUNTER — Encounter: Payer: Self-pay | Admitting: Family

## 2020-07-18 ENCOUNTER — Other Ambulatory Visit: Payer: Self-pay

## 2020-07-18 DIAGNOSIS — Z0000 Encounter for general adult medical examination without abnormal findings: Secondary | ICD-10-CM

## 2020-07-18 DIAGNOSIS — B20 Human immunodeficiency virus [HIV] disease: Secondary | ICD-10-CM

## 2020-07-18 DIAGNOSIS — A528 Late syphilis, latent: Secondary | ICD-10-CM

## 2020-07-18 DIAGNOSIS — Z0001 Encounter for general adult medical examination with abnormal findings: Secondary | ICD-10-CM

## 2020-07-18 NOTE — Assessment & Plan Note (Signed)
Patrick Glass has less than optimal adherence to his ART regimen secondary to gastrointestinal symptoms when taking Genvoya.  No signs/symptoms of opportunistic infection or progressive HIV disease.  Discussed importance of taking medications daily as prescribed and notifying providers side effects.  Change Genvoya to Dovato.  Samples provided while awaiting financial assistance renewal.  Plan for follow-up in 1 month or sooner if needed with lab work on the same day.

## 2020-07-18 NOTE — Assessment & Plan Note (Signed)
Previously treated for syphilis with 1: 69 with most recent RPR titer 1: 4.  To be serofast with no side effects presently.  Continue to monitor.

## 2020-07-18 NOTE — Patient Instructions (Signed)
Nice to see you.  We will change your medicaiton to Dovato.  Let us know if you continue to have stomach issues with it.  Plan for follow up in 6 weeks or sooner if needed with lab work 1-2 weeks prior to your appointment.  Have a great day and stay safe!

## 2020-07-18 NOTE — Progress Notes (Signed)
Subjective:    Patient ID: Patrick Glass, male    DOB: Apr 19, 1991, 30 y.o.   MRN: [id number]  Chief Complaint  Patient presents with   Follow-up    No questions or concerns     HPI:  Patrick Glass is a 29 y.o. male with HIV disease with risk factors for acquiring HIV being MSM who was last seen in the office on 07/19/19 with well controlled HIV disease and good adherence and tolerance to Genvoya. Viral load at the time was undetectable and CD4 count was 757. Most recent blood work completed on 07/08/20 with viral load of 26,100 and CD4 count of 488. RPR titer positive at 1:4 with previous treatment at 1:16. Here today for routine follow up.   Mr. Glass has been off his medication for a period of time secondary to have changes in his eating habits with Genvoya causing abdominal discomfort. Overall feeling well today. Denies fevers, chills, night sweats, headaches, changes in vision, neck pain/stiffness, nausea, diarrhea, vomiting, lesions or rashes.  Mr. Glass has no problems obtaining his medication from the pharmacy and remains covered through [facility] which has been renewed.  Denies feelings of being down, depressed or hopeless recently, but has had some previously with the passing of his mother. Currently with no recreational or illicit drug use and rare tobacco use and alcohol consumption. Uses condoms when sexually active and declines condoms today. Interested in getting COVID vaccine. Due for routine dental care.    No Known Allergies    Outpatient Medications Prior to Visit  Medication Sig Dispense Refill   elvitegravir-cobicistat-emtricitabine-tenofovir (GENVOYA) 150-150-200-10 MG TABS tablet Take 1 tablet by mouth daily. 30 tablet 5   cyclobenzaprine (FLEXERIL) 5 MG tablet Take 1 tablet (5 mg total) by mouth 2 (two) times daily as needed for muscle spasms. (Patient not taking: Reported on 01/05/2019) 15 tablet 0   No facility-administered medications prior to visit.      Past Medical History:  Diagnosis Date   HIV (human immunodeficiency virus infection) (HCC)      Past Surgical History:  Procedure Laterality Date   FINGER SURGERY      Review of Systems  Constitutional: Negative for appetite change, chills, fatigue, fever and unexpected weight change.  Eyes: Negative for visual disturbance.  Respiratory: Negative for cough, chest tightness, shortness of breath and wheezing.   Cardiovascular: Negative for chest pain and leg swelling.  Gastrointestinal: Negative for abdominal pain, constipation, diarrhea, nausea and vomiting.  Genitourinary: Negative for dysuria, flank pain, frequency, genital sores, hematuria and urgency.  Skin: Negative for rash.  Allergic/Immunologic: Negative for immunocompromised state.  Neurological: Negative for dizziness and headaches.      Objective:    BP 127/74    Pulse 83    Temp 98 F (36.7 C)    Wt 118 lb (53.5 kg)    SpO2 100%    BMI 18.48 kg/m  Nursing note and vital signs reviewed.  Physical Exam Constitutional:      General: He is not in acute distress.    Appearance: He is well-developed.  Eyes:     Conjunctiva/sclera: Conjunctivae normal.  Cardiovascular:     Rate and Rhythm: Normal rate and regular rhythm.     Heart sounds: Normal heart sounds. No murmur heard.  No friction rub. No gallop.   Pulmonary:     Effort: Pulmonary effort is normal. No respiratory distress.     Breath sounds: Normal breath sounds. No wheezing or rales.  Chest:     Chest wall: No tenderness.  Abdominal:     General: Bowel sounds are normal.     Palpations: Abdomen is soft.     Tenderness: There is no abdominal tenderness.  Musculoskeletal:     Cervical back: Neck supple.  Lymphadenopathy:     Cervical: No cervical adenopathy.  Skin:    General: Skin is warm and dry.     Findings: No rash.  Neurological:     Mental Status: He is alert and oriented to person, place, and time.  Psychiatric:        Behavior:  Behavior normal.        Thought Content: Thought content normal.        Judgment: Judgment normal.      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 Glass has less than optimal adherence to his ART regimen secondary to gastrointestinal symptoms when taking Genvoya.  No signs/symptoms of opportunistic infection or progressive HIV disease.  Discussed importance of taking medications daily as prescribed and notifying providers side effects.  Change Genvoya to Dovato.  Samples provided while awaiting financial assistance renewal.  Plan for follow-up in 1 month or sooner if needed with lab work on the same day.      Health care maintenance     Discussed/recommended Covid vaccine.  Discussed importance of safe sexual practice to reduce risk of STI.  Condoms declined.      Late latent syphilis    Previously treated for syphilis with 1: 100 with most recent RPR titer 1: 4.  To be serofast with no side effects presently.  Continue to monitor.         I am having Patrick Glass maintain his cyclobenzaprine and Genvoya.   Follow-up: Return in about 1 month (around 08/18/2020), or if symptoms worsen or fail to improve.   Marcos Eke, MSN, FNP-C Nurse Practitioner [facility] for Infectious Disease [facility] Main number: [phone]

## 2020-07-18 NOTE — Assessment & Plan Note (Signed)
   Discussed/recommended Covid vaccine.  Discussed importance of safe sexual practice to reduce risk of STI.  Condoms declined. 

## 2020-08-26 ENCOUNTER — Other Ambulatory Visit: Payer: Self-pay

## 2020-08-26 ENCOUNTER — Other Ambulatory Visit: Payer: Self-pay | Admitting: *Deleted

## 2020-08-26 DIAGNOSIS — B20 Human immunodeficiency virus [HIV] disease: Secondary | ICD-10-CM

## 2020-08-26 NOTE — Addendum Note (Signed)
Addended by: Andree Coss on: 08/26/2020 11:13 AM   Modules accepted: Orders

## 2020-09-01 LAB — HIV RNA, RTPCR W/R GT (RTI, PI,INT)
HIV 1 RNA Quant: <20 copies/mL
HIV-1 RNA Quant, Log: <1.3 Log copies/mL

## 2020-09-11 ENCOUNTER — Encounter: Payer: Medicaid Other | Admitting: Family

## 2020-09-16 ENCOUNTER — Ambulatory Visit (INDEPENDENT_AMBULATORY_CARE_PROVIDER_SITE_OTHER): Payer: Self-pay | Admitting: Family

## 2020-09-16 ENCOUNTER — Other Ambulatory Visit: Payer: Self-pay

## 2020-09-16 ENCOUNTER — Other Ambulatory Visit (HOSPITAL_COMMUNITY)
Admission: RE | Admit: 2020-09-16 | Discharge: 2020-09-16 | Disposition: A | Discharge status: Home / Self Care | Payer: Medicaid Other | Source: Ambulatory Visit | Attending: Family | Admitting: Family

## 2020-09-16 ENCOUNTER — Encounter: Payer: Self-pay | Admitting: Family

## 2020-09-16 DIAGNOSIS — B20 Human immunodeficiency virus [HIV] disease: Secondary | ICD-10-CM

## 2020-09-16 DIAGNOSIS — Z0000 Encounter for general adult medical examination without abnormal findings: Secondary | ICD-10-CM

## 2020-09-16 DIAGNOSIS — Z113 Encounter for screening for infections with a predominantly sexual mode of transmission: Secondary | ICD-10-CM | POA: Diagnosis present

## 2020-09-16 MED ORDER — DOVATO 50-300 MG PO TABS: ORAL_TABLET | ORAL | 4 refills | Status: DC

## 2020-09-16 NOTE — Patient Instructions (Addendum)
Nice to see you.  We will continue your current dose of Dovato.  Plan for follow up in 3 months or sooner if needed with lab work 1-2 weeks prior to appointment.  Have a great day and stay safe!

## 2020-09-16 NOTE — Progress Notes (Signed)
Subjective:    Patient ID: Patrick Glass, male    DOB: 1991-09-04, 29 y.o.   MRN: [id number]  Chief Complaint  Patient presents with  . Follow-up    B20     HPI:  Patrick Glass is a 29 y.o. male with HIV disease who was last seen in the office on 07/18/2020 with less than optimal adherence to his ART regimen of Genvoya due to gastrointestinal symptoms.  He was provided with Dovato while awaiting financial assistance.  Viral load at the time was 26,100 with CD4 count of 488.  Most recent blood work completed on 08/26/2020 with a viral load that is undetectable.  Here today for routine follow-up.  [name] continues to take his Dovato as prescribed daily with no adverse side effects or missed doses since his last office visit.  Overall feeling well today with no new concerns/complaints. Denies fevers, chills, night sweats, headaches, changes in vision, neck pain/stiffness, nausea, diarrhea, vomiting, lesions or rashes.  [name] has not had to receive a refill of his medications as he was given a sample of the last time.  Denies feelings of being down, depressed, or hopeless recently.  No recreational or illicit drug use or alcohol consumption.  Uses tobacco on occasion.  Declines vaccines today.  Due for routine dental care.  Would like to do STI testing.   No Known Allergies    Outpatient Medications Prior to Visit  Medication Sig Dispense Refill  . cyclobenzaprine (FLEXERIL) 5 MG tablet Take 1 tablet (5 mg total) by mouth 2 (two) times daily as needed for muscle spasms. (Patient not taking: Reported on 01/05/2019) 15 tablet 0  . elvitegravir-cobicistat-emtricitabine-tenofovir (GENVOYA) 150-150-200-10 MG TABS tablet Take 1 tablet by mouth daily. (Patient not taking: Reported on 09/16/2020) 30 tablet 5   No facility-administered medications prior to visit.     Past Medical History:  Diagnosis Date  . HIV (human immunodeficiency virus infection) (HCC)      Past Surgical  History:  Procedure Laterality Date  . FINGER SURGERY         Review of Systems  Constitutional: Negative for appetite change, chills, fatigue, fever and unexpected weight change.  Eyes: Negative for visual disturbance.  Respiratory: Negative for cough, chest tightness, shortness of breath and wheezing.   Cardiovascular: Negative for chest pain and leg swelling.  Gastrointestinal: Negative for abdominal pain, constipation, diarrhea, nausea and vomiting.  Genitourinary: Negative for dysuria, flank pain, frequency, genital sores, hematuria and urgency.  Skin: Negative for rash.  Allergic/Immunologic: Negative for immunocompromised state.  Neurological: Negative for dizziness and headaches.      Objective:    BP 113/79   Pulse 82   Temp 98.8 F (37.1 C) (Oral)   Wt 119 lb (54 kg)   BMI 18.64 kg/m  Nursing note and vital signs reviewed.  Physical Exam Constitutional:      General: He is not in acute distress.    Appearance: He is well-developed.  Eyes:     Conjunctiva/sclera: Conjunctivae normal.  Cardiovascular:     Rate and Rhythm: Normal rate and regular rhythm.     Heart sounds: Normal heart sounds. No murmur heard.  No friction rub. No gallop.   Pulmonary:     Effort: Pulmonary effort is normal. No respiratory distress.     Breath sounds: Normal breath sounds. No wheezing or rales.  Chest:     Chest wall: No tenderness.  Abdominal:     General: Bowel sounds  are normal.     Palpations: Abdomen is soft.     Tenderness: There is no abdominal tenderness.  Musculoskeletal:     Cervical back: Neck supple.  Lymphadenopathy:     Cervical: No cervical adenopathy.  Skin:    General: Skin is warm and dry.     Findings: No rash.  Neurological:     Mental Status: He is alert and oriented to person, place, and time.  Psychiatric:        Behavior: Behavior normal.        Thought Content: Thought content normal.        Judgment: Judgment normal.      Depression  screen [facility] 2/9 09/16/2020 07/18/2020 07/19/2019 01/05/2019 06/13/2018  Decreased Interest 0 0 0 0 0  Down, Depressed, Hopeless 0 0 0 0 0  PHQ - 2 Score 0 0 0 0 0       Assessment & Plan:    Patient Active Problem List   Diagnosis Date Noted  . 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 Tobacco abuse 03/22/2017  . Screening examination for venereal disease 07/09/2014  . Encounter for long-term (current) use of medications 07/09/2014  . Health care maintenance 09/27/2013  . Human immunodeficiency virus (HIV) disease (HCC) 04/03/2013     Problem List Items Addressed This Visit      Other   Human immunodeficiency virus (HIV) disease (HCC) - Primary    [name] appears to have improved adherence with good tolerance to his new regimen of Dovato.  No signs/symptoms of opportunistic infection or progressive HIV disease.  We reviewed previous lab work and discussed plan of care.  Financial assistance is now up-to-date.  Continue current dose of Dovato.  Plan for follow-up in 3 months or sooner if needed.      Relevant Medications   Dolutegravir-lamiVUDine (DOVATO) 50-300 MG TABS   Health care maintenance     Discussed importance of safe sexual practice to reduce risk of STI.  Condoms declined.  STI testing today.  Declines Covid vaccine.  Due for routine dental care with referral placed to [facility].       Other Visit Diagnoses    Screening for STDs (sexually transmitted diseases)       Relevant Orders   Cytology (oral, anal, urethral) ancillary only   Cytology (oral, anal, urethral) ancillary only   Urine cytology ancillary only([place])       I have discontinued Aaro Q. Kryder's Genvoya. I am also having him start on Dovato. Additionally, I am having him maintain his cyclobenzaprine.   Meds ordered this encounter  Medications  . Dolutegravir-lamiVUDine (DOVATO) 50-300 MG TABS    Sig: Take 1 tablet by mouth daily.    Dispense:  30 tablet    Refill:  4    Order Specific  Question:   Supervising Provider    Answer:   Judyann Munson [[id number]]     Follow-up: Return in about 3 months (around 12/17/2020), or if symptoms worsen or fail to improve.   Marcos Eke, MSN, FNP-C Nurse Practitioner [facility]ious Disease [facility] Main number: [phone]

## 2020-09-16 NOTE — Assessment & Plan Note (Signed)
Patrick Glass appears to have improved adherence with good tolerance to his new regimen of Dovato.  No signs/symptoms of opportunistic infection or progressive HIV disease.  We reviewed previous lab work and discussed plan of care.  Financial assistance is now up-to-date.  Continue current dose of Dovato.  Plan for follow-up in 3 months or sooner if needed.

## 2020-09-16 NOTE — Assessment & Plan Note (Signed)
   Discussed importance of safe sexual practice to reduce risk of STI.  Condoms declined.  STI testing today.  Declines Covid vaccine.  Due for routine dental care with referral placed to [facility].

## 2020-09-17 LAB — CYTOLOGY, (ORAL, ANAL, URETHRAL) ANCILLARY ONLY
Chlamydia: NEGATIVE
Chlamydia: NEGATIVE
Comment: NEGATIVE
Comment: NEGATIVE
Comment: NORMAL
Comment: NORMAL
Neisseria Gonorrhea: NEGATIVE
Neisseria Gonorrhea: NEGATIVE

## 2020-09-17 LAB — URINE CYTOLOGY ANCILLARY ONLY
Chlamydia: NEGATIVE
Comment: NEGATIVE
Comment: NORMAL
Neisseria Gonorrhea: NEGATIVE

## 2020-09-18 ENCOUNTER — Telehealth: Payer: Self-pay

## 2020-09-18 NOTE — Telephone Encounter (Signed)
Called patient regarding lab results. Patient does not have any questions at this time. Patrick Glass, [place]

## 2020-09-18 NOTE — Telephone Encounter (Signed)
-----   Message from Veryl Speak, FNP sent at 09/18/2020  8:47 AM EDT ----- Please inform Patrick Glass that his STI testing was all negative for gonorrhea and chlamydia.

## 2020-12-15 ENCOUNTER — Other Ambulatory Visit: Payer: Medicaid Other

## 2020-12-29 ENCOUNTER — Encounter: Payer: Medicaid Other | Admitting: Family

## 2021-06-29 ENCOUNTER — Other Ambulatory Visit: Payer: Self-pay

## 2021-06-29 ENCOUNTER — Telehealth: Payer: Self-pay

## 2021-06-29 ENCOUNTER — Ambulatory Visit: Payer: Medicaid Other

## 2021-06-29 DIAGNOSIS — Z113 Encounter for screening for infections with a predominantly sexual mode of transmission: Secondary | ICD-10-CM

## 2021-06-29 DIAGNOSIS — B20 Human immunodeficiency virus [HIV] disease: Secondary | ICD-10-CM

## 2021-06-29 NOTE — Telephone Encounter (Signed)
Medication Samples have been provided to the patient.  Drug name: Dovato        Strength: 50/300 mg         Qty: 28 (2 bottles)   LOT: [id number]   Exp.Date: 12/23  Dosing instructions: Take one tablet by mouth once daily  Clearance Coots, CPhT Specialty Pharmacy Patient [facility] for Infectious Disease Phone: [phone] Fax:  [phone]

## 2021-06-30 LAB — T-HELPER CELL (CD4) - (RCID CLINIC ONLY)
CD4 % Helper T Cell: 35 % (ref 33–65)
CD4 T Cell Abs: 593 /uL (ref 400–1790)

## 2021-07-02 LAB — COMPREHENSIVE METABOLIC PANEL
AG Ratio: 1.7 (calc) (ref 1.0–2.5)
ALT: 6 U/L — ABNORMAL LOW (ref 9–46)
AST: 13 U/L (ref 10–40)
Albumin: 4.5 g/dL (ref 3.6–5.1)
Alkaline phosphatase (APISO): 71 U/L (ref 36–130)
BUN: 18 mg/dL (ref 7–25)
CO2: 29 mmol/L (ref 20–32)
Calcium: 9.5 mg/dL (ref 8.6–10.3)
Chloride: 104 mmol/L (ref 98–110)
Creat: 1.12 mg/dL (ref 0.60–1.24)
Globulin: 2.7 g/dL (calc) (ref 1.9–3.7)
Glucose, Bld: 94 mg/dL (ref 65–99)
Potassium: 4.3 mmol/L (ref 3.5–5.3)
Sodium: 138 mmol/L (ref 135–146)
Total Bilirubin: 0.4 mg/dL (ref 0.2–1.2)
Total Protein: 7.2 g/dL (ref 6.1–8.1)

## 2021-07-02 LAB — HIV-1 RNA QUANT-NO REFLEX-BLD
HIV 1 RNA Quant: 162 Copies/mL — ABNORMAL HIGH
HIV-1 RNA Quant, Log: 2.21 Log cps/mL — ABNORMAL HIGH

## 2021-07-02 LAB — FLUORESCENT TREPONEMAL AB(FTA)-IGG-BLD: Fluorescent Treponemal ABS: REACTIVE — AB

## 2021-07-02 LAB — RPR: RPR Ser Ql: REACTIVE — AB

## 2021-07-02 LAB — RPR TITER: RPR Titer: 1:4 {titer} — ABNORMAL HIGH

## 2021-07-16 ENCOUNTER — Encounter: Payer: Self-pay | Admitting: Family

## 2021-07-23 ENCOUNTER — Other Ambulatory Visit: Payer: Self-pay

## 2021-07-23 ENCOUNTER — Ambulatory Visit (INDEPENDENT_AMBULATORY_CARE_PROVIDER_SITE_OTHER): Payer: Self-pay | Admitting: Family

## 2021-07-23 ENCOUNTER — Encounter: Payer: Self-pay | Admitting: Family

## 2021-07-23 VITALS — BP 116/58 | HR 59 | Temp 98.5°F | Wt 122.0 lb

## 2021-07-23 DIAGNOSIS — Z0000 Encounter for general adult medical examination without abnormal findings: Secondary | ICD-10-CM

## 2021-07-23 DIAGNOSIS — B20 Human immunodeficiency virus [HIV] disease: Secondary | ICD-10-CM

## 2021-07-23 DIAGNOSIS — A528 Late syphilis, latent: Secondary | ICD-10-CM

## 2021-07-23 DIAGNOSIS — Z23 Encounter for immunization: Secondary | ICD-10-CM

## 2021-07-23 MED ORDER — DOVATO 50-300 MG PO TABS: ORAL_TABLET | ORAL | 4 refills | Status: DC

## 2021-07-23 NOTE — Patient Instructions (Signed)
Nice to see you.  Continue to take your medication daily.  Refill will be sent to the pharmacy.   Plan for follow up in 1 month or sooner if needed with lab work on the same day.   Have a great day and stay safe!

## 2021-07-23 NOTE — Assessment & Plan Note (Signed)
RPR remains serofast at 1: 4 with previous treatment at 1: 16.  Discussed importance of safe sexual practice.  Continue to monitor.

## 2021-07-23 NOTE — Progress Notes (Signed)
Brief Narrative   Patient ID: Patrick Glass, male    DOB: Nov 09, 1991, 30 y.o.   MRN: [id number]  Mr. Glass is a 30 y/o AA male with HIV disease diagnosed in April 2014 with risk factor of MSM. Genotype with K103N resistance at baseline. Initial viral load of 63,103 and CD4 count of 320. Entered care in CDC Stage 2. [id number] negative. No history of opportunistic infection. Previous ART experience with Stribild, Darrin Luis, and now Dovato.   Subjective:    Chief Complaint  Patient presents with   Follow-up    B20     HPI:  Patrick Glass is a 30 y.o. male with HIV disease last seen on 09/16/2020 with improved adherence and good tolerance to Dovato.  Viral load was undetectable with CD4 count of 480.  STI testing was negative.  Most recent blood work completed on 06/29/2021 with viral load of 162 and CD4 count of 593.  RPR remains serofast at 1: 4 with previous treatment 1: 16.  Kidney function, liver function, electrolytes within normal ranges.  Here today for routine follow-up.  Mr. Glass restarted taking his medications approximately 3 weeks ago after being off medications for 1 month.  Overall feeling well today with no new concerns/complaints.  Has interest in the long-term injectable medication. Denies fevers, chills, night sweats, headaches, changes in vision, neck pain/stiffness, nausea, diarrhea, vomiting, lesions or rashes.  Mr. Sees financial assistance ran out and was renewed during most recent lab work appointment.  Denies feelings of being down, depressed, or hopeless recently.  No current recreational or illicit drug use, or alcohol consumption and tobacco use on occasion.  Healthcare maintenance due includes Prevnar and tetanus.  Condoms offered.   No Known Allergies    Outpatient Medications Prior to Visit  Medication Sig Dispense Refill   Dolutegravir-lamiVUDine (DOVATO) 50-300 MG TABS Take 1 tablet by mouth daily. 30 tablet 4   cyclobenzaprine (FLEXERIL) 5 MG  tablet Take 1 tablet (5 mg total) by mouth 2 (two) times daily as needed for muscle spasms. (Patient not taking: No sig reported) 15 tablet 0   No facility-administered medications prior to visit.     Past Medical History:  Diagnosis Date   HIV (human immunodeficiency virus infection) (HCC)      Past Surgical History:  Procedure Laterality Date   FINGER SURGERY        Review of Systems  Constitutional:  Negative for appetite change, chills, fatigue, fever and unexpected weight change.  Eyes:  Negative for visual disturbance.  Respiratory:  Negative for cough, chest tightness, shortness of breath and wheezing.   Cardiovascular:  Negative for chest pain and leg swelling.  Gastrointestinal:  Negative for abdominal pain, constipation, diarrhea, nausea and vomiting.  Genitourinary:  Negative for dysuria, flank pain, frequency, genital sores, hematuria and urgency.  Skin:  Negative for rash.  Allergic/Immunologic: Negative for immunocompromised state.  Neurological:  Negative for dizziness and headaches.     Objective:    BP (!) 116/58   Pulse (!) 59   Temp 98.5 F (36.9 C) (Oral)   Wt 122 lb (55.3 kg)   BMI 19.11 kg/m  Nursing note and vital signs reviewed.  Physical Exam Constitutional:      General: He is not in acute distress.    Appearance: He is well-developed.  Eyes:     Conjunctiva/sclera: Conjunctivae normal.  Cardiovascular:     Rate and Rhythm: Normal rate and regular rhythm.     Heart  sounds: Normal heart sounds. No murmur heard.   No friction rub. No gallop.  Pulmonary:     Effort: Pulmonary effort is normal. No respiratory distress.     Breath sounds: Normal breath sounds. No wheezing or rales.  Chest:     Chest wall: No tenderness.  Abdominal:     General: Bowel sounds are normal.     Palpations: Abdomen is soft.     Tenderness: There is no abdominal tenderness.  Musculoskeletal:     Cervical back: Neck supple.  Lymphadenopathy:     Cervical: No  cervical adenopathy.  Skin:    General: Skin is warm and dry.     Findings: No rash.  Neurological:     Mental Status: He is alert and oriented to person, place, and time.  Psychiatric:        Behavior: Behavior normal.        Thought Content: Thought content normal.        Judgment: Judgment normal.     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 Glass has waxing and waning adherence with good tolerance to his ART regimen of Dovato.  Based on refills he should have ran out of medications February 2022.  No signs/symptoms of opportunistic infection.  We reviewed previous lab work and discussed plan of care.  Discussed importance of taking medications daily as prescribed.  Financial assistance pending and samples provided and recorded in pharmacy log.  Continue current dose of Dovato. Discussed at this point he is not an ideal candidate based on follow ups for [facility] and would need to see improvements in follow up. Plan for follow-up in 1 month or sooner if needed with lab work on the same day.      Relevant Medications   Dolutegravir-lamiVUDine (DOVATO) 50-300 MG TABS   Health care maintenance    Discussed importance of safe sexual practices to reduce risk of STI.  Condoms offered. Pneumovax updated today.      Late latent syphilis    RPR remains serofast at 1: 4 with previous  treatment at 1: 16.  Discussed importance of safe sexual practice.  Continue to monitor.      Relevant Medications   Dolutegravir-lamiVUDine (DOVATO) 50-300 MG TABS   Other Visit Diagnoses     Need for pneumococcal vaccination    -  Primary   Relevant Orders   Pneumococcal polysaccharide vaccine 23-valent greater than or equal to 2yo subcutaneous/IM (Completed)        I am having Shakil Q. Glass maintain his cyclobenzaprine and Dovato.   Meds ordered this encounter  Medications   Dolutegravir-lamiVUDine (DOVATO) 50-300 MG TABS    Sig: Take 1 tablet by mouth daily.    Dispense:  30 tablet    Refill:  4  Please fill once UMAP approved.    Order Specific Question:   Supervising Provider    Answer:   Judyann Munson [[id number]]      Follow-up: Return in about 1 month (around 08/23/2021), or if symptoms worsen or fail to improve.   Marcos Eke, MSN, FNP-C Nurse Practitioner [facility] for Infectious Disease [facility] Main number: [phone]

## 2021-07-23 NOTE — Assessment & Plan Note (Signed)
   Discussed importance of safe sexual practices to reduce risk of STI.  Condoms offered.  Pneumovax updated today.

## 2021-07-23 NOTE — Assessment & Plan Note (Addendum)
Patrick Glass has waxing and waning adherence with good tolerance to his ART regimen of Dovato.  Based on refills he should have ran out of medications February 2022.  No signs/symptoms of opportunistic infection.  We reviewed previous lab work and discussed plan of care.  Discussed importance of taking medications daily as prescribed.  Financial assistance pending and samples provided and recorded in pharmacy log.  Continue current dose of Dovato. Discussed at this point he is not an ideal candidate based on follow ups for [facility] and would need to see improvements in follow up. Plan for follow-up in 1 month or sooner if needed with lab work on the same day.

## 2021-07-28 ENCOUNTER — Encounter: Payer: Self-pay | Admitting: Family

## 2021-08-07 ENCOUNTER — Other Ambulatory Visit: Payer: Self-pay | Admitting: Pharmacist

## 2021-08-07 DIAGNOSIS — B20 Human immunodeficiency virus [HIV] disease: Secondary | ICD-10-CM

## 2021-08-07 MED ORDER — DOVATO 50-300 MG PO TABS: 1.0000 | ORAL_TABLET | Freq: Every day | ORAL | 0 refills | Status: DC

## 2021-08-07 NOTE — Progress Notes (Signed)
Medication Samples have been provided to the patient.  Drug name: Dovato        Strength: 50/300 mg         Qty: 28 Tablets (2 bottles) LOT: [id number]   Exp.Date: 12/23  Dosing instructions: Take one tablet by mouth once daily  The patient has been instructed regarding the correct time, dose, and frequency of taking this medication, including desired effects and most common side effects.   Margarite Gouge, PharmD, CPP Clinical Pharmacist Practitioner Infectious Diseases Clinical Pharmacist [facility]  11/17/2020, 10:07 AM

## 2021-08-17 ENCOUNTER — Ambulatory Visit (INDEPENDENT_AMBULATORY_CARE_PROVIDER_SITE_OTHER): Payer: Self-pay | Admitting: Family

## 2021-08-17 ENCOUNTER — Encounter: Payer: Self-pay | Admitting: Family

## 2021-08-17 ENCOUNTER — Other Ambulatory Visit: Payer: Self-pay

## 2021-08-17 VITALS — BP 119/77 | HR 80 | Temp 98.2°F | Ht 67.0 in | Wt 119.0 lb

## 2021-08-17 DIAGNOSIS — Z79899 Other long term (current) drug therapy: Secondary | ICD-10-CM

## 2021-08-17 DIAGNOSIS — B20 Human immunodeficiency virus [HIV] disease: Secondary | ICD-10-CM

## 2021-08-17 DIAGNOSIS — Z23 Encounter for immunization: Secondary | ICD-10-CM

## 2021-08-17 DIAGNOSIS — Z0000 Encounter for general adult medical examination without abnormal findings: Secondary | ICD-10-CM

## 2021-08-17 DIAGNOSIS — Z113 Encounter for screening for infections with a predominantly sexual mode of transmission: Secondary | ICD-10-CM

## 2021-08-17 NOTE — Assessment & Plan Note (Signed)
Patrick Glass has improved adherence and good tolerance to his ART regimen of Dovato.  No signs/symptoms of opportunistic infection.  Reviewed previous lab work and emphasized importance of taking medication daily as prescribed.  Check blood work today.  Continue current dose of Dovato.  Plan for follow-up in 3 months or sooner if needed with lab work on the same day.

## 2021-08-17 NOTE — Progress Notes (Signed)
Brief Narrative   Patient ID: Patrick Glass, male    DOB: Aug 29, 1991, 30 y.o.   MRN: [id number]  Patrick Glass is a 30 y/o AA male with HIV disease diagnosed in April 2014 with risk factor of MSM. Genotype with K103N resistance at baseline. Initial viral load of 63,103 and CD4 count of 320. Entered care in CDC Stage 2. [id number] negative. No history of opportunistic infection. Previous ART experience with Stribild, Darrin Luis, and now Dovato.  Subjective:    Chief Complaint  Patient presents with   Follow-up    Given condoms     HPI:  Patrick Glass is a 30 y.o. male with HIV disease last seen on 07/23/2021 after restarting taking medications approximately 3 weeks prior and having been off medications for 1 month.  Viral load was 162 with a CD4 count of 593.  Here today for routine follow-up.  Patrick Glass continues to take his Dovato daily as prescribed and 1 missed dose since his last office visit.  Overall feeling well.  No new concerns/complaints. Denies fevers, chills, night sweats, headaches, changes in vision, neck pain/stiffness, nausea, diarrhea, vomiting, lesions or rashes.  Patrick Glass has no problems obtaining medication from the pharmacy with UMAP now approved.  Denies feelings of being down, depressed, or hopeless recently.  No current recreational or illicit drug use or tobacco use with occasional alcohol consumption.  Condoms offered and provided.  Interested in STD testing and Monkeypox vaccine.   No Known Allergies    Outpatient Medications Prior to Visit  Medication Sig Dispense Refill   Dolutegravir-lamiVUDine (DOVATO) 50-300 MG TABS Take 1 tablet by mouth daily. 30 tablet 4   cyclobenzaprine (FLEXERIL) 5 MG tablet Take 1 tablet (5 mg total) by mouth 2 (two) times daily as needed for muscle spasms. (Patient not taking: No sig reported) 15 tablet 0   Dolutegravir-lamiVUDine (DOVATO) 50-300 MG TABS Take 1 tablet by mouth daily for 28 days. 28 tablet 0   No  facility-administered medications prior to visit.     Past Medical History:  Diagnosis Date   HIV (human immunodeficiency virus infection) (HCC)      Past Surgical History:  Procedure Laterality Date   FINGER SURGERY        Review of Systems  Constitutional:  Negative for appetite change, chills, fatigue, fever and unexpected weight change.  Eyes:  Negative for visual disturbance.  Respiratory:  Negative for cough, chest tightness, shortness of breath and wheezing.   Cardiovascular:  Negative for chest pain and leg swelling.  Gastrointestinal:  Negative for abdominal pain, constipation, diarrhea, nausea and vomiting.  Genitourinary:  Negative for dysuria, flank pain, frequency, genital sores, hematuria and urgency.  Skin:  Negative for rash.  Allergic/Immunologic: Negative for immunocompromised state.  Neurological:  Negative for dizziness and headaches.     Objective:    BP 119/77   Pulse 80   Temp 98.2 F (36.8 C) (Oral)   Ht 5\' 7"  (1.702 m)   Wt 119 lb (54 kg)   SpO2 98%   BMI 18.64 kg/m  Nursing note and vital signs reviewed.  Physical Exam Constitutional:      General: He is not in acute distress.    Appearance: He is well-developed.  Eyes:     Conjunctiva/sclera: Conjunctivae normal.  Cardiovascular:     Rate and Rhythm: Normal rate and regular rhythm.     Heart sounds: Normal heart sounds. No murmur heard.   No friction rub. No gallop.  Pulmonary:     Effort: Pulmonary effort is normal. No respiratory distress.     Breath sounds: Normal breath sounds. No wheezing or rales.  Chest:     Chest wall: No tenderness.  Abdominal:     General: Bowel sounds are normal.     Palpations: Abdomen is soft.     Tenderness: There is no abdominal tenderness.  Musculoskeletal:     Cervical back: Neck supple.  Lymphadenopathy:     Cervical: No cervical adenopathy.  Skin:    General: Skin is warm and dry.     Findings: No rash.  Neurological:     Mental Status:  He is alert and oriented to person, place, and time.  Psychiatric:        Behavior: Behavior normal.        Thought Content: Thought content normal.        Judgment: Judgment normal.     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  No signs/symptoms of opportunistic infection.  Reviewed previous lab work and emphasized importance of taking medication daily as prescribed.  Check blood work today.  Continue current dose of Dovato.  Plan for follow-up in 3 months or sooner if needed with lab work on the same day.      Relevant Orders   T-helper cell (CD4)- ([facility] clinic only)   HIV-1 RNA quant-no reflex-bld   Health care maintenance    Discussed importance of safe sexual practice to reduce risk of STI.  Condoms offered and provided. Menveo updated First dose of Monkeypox provided.       Screening examination for venereal disease   Relevant Orders   Cytology (oral, anal, urethral) ancillary only   Cytology (oral, anal, urethral) ancillary only   Urine cytology ancillary only([place])   Other Visit Diagnoses     Pharmacologic therapy       Relevant Orders   Lipid Profile   Need for smallpox immunization       Relevant Orders   Vaccinia,Smallpox Monkeypox vaccine([place]) (Completed)    Need for meningococcal vaccination       Relevant Orders   MENINGOCOCCAL MCV4O (Completed)        I have discontinued Tremar Q. Carlini's cyclobenzaprine. I am also having him maintain his Dovato.   Follow-up: Return in about 3 months (around 11/16/2021).   Marcos Eke, MSN, FNP-C Nurse Practitioner [facility] for Infectious Disease [facility] Main number: [phone]

## 2021-08-17 NOTE — Patient Instructions (Addendum)
Nice to see you.  We will check your lab work today.   Continue to take your medication daily.   Refills are available at the pharmacy.   Plan for follow up in 3 months or sooner if needed with lab work on the same day.  Have a great day and stay safe!

## 2021-08-17 NOTE — Progress Notes (Signed)
    [facility]  Name:  Patrick Glass    MRN: [id number] DOB: 01/19/1991   08/17/2021  [name] was observed post [facility] immunization for 15 minutes without incident. He was provided with Vaccine Information Sheet and instruction to access the V-Safe system.   [name] was instructed to call 911 with any severe reactions post vaccine: Difficulty breathing  Swelling of face and throat  A fast heartbeat  A bad rash all over body  Dizziness and weakness     Sandie Ano, RN

## 2021-08-17 NOTE — Assessment & Plan Note (Signed)
   Discussed importance of safe sexual practice to reduce risk of STI.  Condoms offered and provided.  Menveo updated  First dose of Monkeypox provided.

## 2021-08-18 LAB — CYTOLOGY, (ORAL, ANAL, URETHRAL) ANCILLARY ONLY
Chlamydia: NEGATIVE
Chlamydia: NEGATIVE
Comment: NEGATIVE
Comment: NEGATIVE
Comment: NORMAL
Comment: NORMAL
Neisseria Gonorrhea: NEGATIVE
Neisseria Gonorrhea: NEGATIVE

## 2021-08-18 LAB — URINE CYTOLOGY ANCILLARY ONLY
Chlamydia: NEGATIVE
Comment: NEGATIVE
Comment: NORMAL
Neisseria Gonorrhea: NEGATIVE

## 2021-08-18 LAB — T-HELPER CELL (CD4) - (RCID CLINIC ONLY)
CD4 % Helper T Cell: 42 % (ref 33–65)
CD4 T Cell Abs: 727 /uL (ref 400–1790)

## 2021-08-22 LAB — LIPID PANEL
Cholesterol: 187 mg/dL (ref <200)
HDL: 49 mg/dL (ref >=40)
LDL Cholesterol (Calc): 120 mg/dL (calc) — ABNORMAL HIGH
Non-HDL Cholesterol (Calc): 138 mg/dL (calc) — ABNORMAL HIGH (ref <130)
Total CHOL/HDL Ratio: 3.8 (calc) (ref <5.0)
Triglycerides: 82 mg/dL (ref <150)

## 2021-08-22 LAB — HIV-1 RNA QUANT-NO REFLEX-BLD
HIV 1 RNA Quant: <20 Copies/mL — ABNORMAL HIGH
HIV-1 RNA Quant, Log: <1.3 Log cps/mL — ABNORMAL HIGH

## 2021-10-11 ENCOUNTER — Emergency Department (HOSPITAL_COMMUNITY)
Admission: EM | Admit: 2021-10-11 | Discharge: 2021-10-11 | Disposition: A | Discharge status: Home / Self Care | Payer: No Typology Code available for payment source | Attending: Emergency Medicine | Admitting: Emergency Medicine

## 2021-10-11 ENCOUNTER — Encounter (HOSPITAL_COMMUNITY): Payer: Self-pay | Admitting: Emergency Medicine

## 2021-10-11 ENCOUNTER — Other Ambulatory Visit: Payer: Self-pay

## 2021-10-11 DIAGNOSIS — Y92410 Unspecified street and highway as the place of occurrence of the external cause: Secondary | ICD-10-CM | POA: Insufficient documentation

## 2021-10-11 DIAGNOSIS — M25519 Pain in unspecified shoulder: Secondary | ICD-10-CM | POA: Diagnosis not present

## 2021-10-11 DIAGNOSIS — R519 Headache, unspecified: Secondary | ICD-10-CM | POA: Diagnosis present

## 2021-10-11 DIAGNOSIS — Z5321 Procedure and treatment not carried out due to patient leaving prior to being seen by health care provider: Secondary | ICD-10-CM | POA: Diagnosis not present
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
## 2021-10-12 ENCOUNTER — Other Ambulatory Visit: Payer: Self-pay

## 2021-10-12 ENCOUNTER — Encounter (HOSPITAL_COMMUNITY): Payer: Self-pay | Admitting: Emergency Medicine

## 2021-10-12 ENCOUNTER — Emergency Department (HOSPITAL_COMMUNITY)
Admission: EM | Admit: 2021-10-12 | Discharge: 2021-10-12 | Disposition: A | Discharge status: Home / Self Care | Payer: No Typology Code available for payment source | Attending: Emergency Medicine | Admitting: Emergency Medicine

## 2021-10-12 DIAGNOSIS — M25512 Pain in left shoulder: Secondary | ICD-10-CM | POA: Diagnosis not present

## 2021-10-12 DIAGNOSIS — M25511 Pain in right shoulder: Secondary | ICD-10-CM | POA: Insufficient documentation

## 2021-10-12 DIAGNOSIS — M545 Low back pain, unspecified: Secondary | ICD-10-CM | POA: Diagnosis not present

## 2021-10-12 DIAGNOSIS — Y92410 Unspecified street and highway as the place of occurrence of the external cause: Secondary | ICD-10-CM | POA: Insufficient documentation

## 2021-10-12 MED ORDER — IBUPROFEN 800 MG PO TABS: 800.0000 mg | ORAL_TABLET | Freq: Three times a day (TID) | ORAL | 0 refills | Status: DC

## 2021-10-12 MED ORDER — METHOCARBAMOL 500 MG PO TABS: 500.0000 mg | ORAL_TABLET | Freq: Two times a day (BID) | ORAL | 0 refills | Status: DC

## 2021-10-12 NOTE — ED Notes (Signed)
Pt not responding for vitals  

## 2021-10-12 NOTE — ED Notes (Signed)
Pt not responding for vitals 4x
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
## 2021-10-13 ENCOUNTER — Encounter (HOSPITAL_COMMUNITY): Payer: Self-pay | Admitting: Emergency Medicine

## 2021-10-16 ENCOUNTER — Other Ambulatory Visit: Payer: Self-pay | Admitting: Family

## 2021-11-10 ENCOUNTER — Ambulatory Visit: Payer: Self-pay | Admitting: Family

## 2021-12-01 ENCOUNTER — Ambulatory Visit: Payer: Self-pay | Admitting: Family

## 2022-01-09 ENCOUNTER — Other Ambulatory Visit: Payer: Self-pay

## 2022-01-09 ENCOUNTER — Emergency Department (HOSPITAL_COMMUNITY)
Admission: EM | Admit: 2022-01-09 | Discharge: 2022-01-09 | Disposition: A | Discharge status: Home / Self Care | Payer: Medicaid Other | Attending: Emergency Medicine | Admitting: Emergency Medicine

## 2022-01-09 DIAGNOSIS — R197 Diarrhea, unspecified: Secondary | ICD-10-CM | POA: Insufficient documentation

## 2022-01-09 DIAGNOSIS — L509 Urticaria, unspecified: Secondary | ICD-10-CM | POA: Insufficient documentation

## 2022-01-09 DIAGNOSIS — Z21 Asymptomatic human immunodeficiency virus [HIV] infection status: Secondary | ICD-10-CM | POA: Insufficient documentation

## 2022-01-09 DIAGNOSIS — R112 Nausea with vomiting, unspecified: Secondary | ICD-10-CM | POA: Insufficient documentation

## 2022-01-09 LAB — COMPREHENSIVE METABOLIC PANEL
ALT: 14 U/L (ref 0–44)
AST: 22 U/L (ref 15–41)
Albumin: 4.6 g/dL (ref 3.5–5.0)
Alkaline Phosphatase: 61 U/L (ref 38–126)
Anion gap: 12 (ref 5–15)
BUN: 15 mg/dL (ref 6–20)
CO2: 23 mmol/L (ref 22–32)
Calcium: 10 mg/dL (ref 8.9–10.3)
Chloride: 102 mmol/L (ref 98–111)
Creatinine, Ser: 0.87 mg/dL (ref 0.61–1.24)
GFR, Estimated: >60 mL/min (ref >60)
Glucose, Bld: 129 mg/dL — ABNORMAL HIGH (ref 70–99)
Potassium: 3.1 mmol/L — ABNORMAL LOW (ref 3.5–5.1)
Sodium: 137 mmol/L (ref 135–145)
Total Bilirubin: 0.6 mg/dL (ref 0.3–1.2)
Total Protein: 7.9 g/dL (ref 6.5–8.1)

## 2022-01-09 LAB — URINALYSIS, ROUTINE W REFLEX MICROSCOPIC
Bilirubin Urine: NEGATIVE
Glucose, UA: NEGATIVE mg/dL
Hgb urine dipstick: NEGATIVE
Ketones, ur: 15 mg/dL — AB
Leukocytes,Ua: NEGATIVE
Nitrite: NEGATIVE
Protein, ur: NEGATIVE mg/dL
Specific Gravity, Urine: 1.02 (ref 1.005–1.030)
pH: 6 (ref 5.0–8.0)

## 2022-01-09 LAB — CBC WITH DIFFERENTIAL/PLATELET
Abs Immature Granulocytes: 0.03 10*3/uL (ref 0.00–0.07)
Basophils Absolute: 0 10*3/uL (ref 0.0–0.1)
Basophils Relative: 0 %
Eosinophils Absolute: 0 10*3/uL (ref 0.0–0.5)
Eosinophils Relative: 0 %
HCT: 42.3 % (ref 39.0–52.0)
Hemoglobin: 15.3 g/dL (ref 13.0–17.0)
Immature Granulocytes: 0 %
Lymphocytes Relative: 19 %
Lymphs Abs: 1.5 10*3/uL (ref 0.7–4.0)
MCH: 32.6 pg (ref 26.0–34.0)
MCHC: 36.2 g/dL — ABNORMAL HIGH (ref 30.0–36.0)
MCV: 90 fL (ref 80.0–100.0)
Monocytes Absolute: 0.4 10*3/uL (ref 0.1–1.0)
Monocytes Relative: 6 %
Neutro Abs: 6 10*3/uL (ref 1.7–7.7)
Neutrophils Relative %: 75 %
Platelets: 316 10*3/uL (ref 150–400)
RBC: 4.7 MIL/uL (ref 4.22–5.81)
RDW: 11.1 % — ABNORMAL LOW (ref 11.5–15.5)
WBC: 8 10*3/uL (ref 4.0–10.5)
nRBC: 0 % (ref 0.0–0.2)

## 2022-01-09 MED ORDER — LACTATED RINGERS IV BOLUS
1000.0000 mL | Freq: Once | INTRAVENOUS | Status: AC
  Administered 2022-01-09: 1000 mL via INTRAVENOUS

## 2022-01-09 MED ORDER — DIPHENHYDRAMINE HCL 50 MG/ML IJ SOLN
25.0000 mg | Freq: Once | INTRAMUSCULAR | Status: AC
  Administered 2022-01-09: 25 mg via INTRAVENOUS
  Filled 2022-01-09: qty 1

## 2022-01-09 MED ORDER — PREDNISONE 10 MG (21) PO TBPK: ORAL_TABLET | ORAL | 0 refills | Status: DC

## 2022-01-09 MED ORDER — ONDANSETRON 4 MG PO TBDP: 4.0000 mg | ORAL_TABLET | Freq: Three times a day (TID) | ORAL | 0 refills | Status: DC | PRN

## 2022-01-09 MED ORDER — METHYLPREDNISOLONE SODIUM SUCC 125 MG IJ SOLR
125.0000 mg | Freq: Once | INTRAMUSCULAR | Status: AC
  Administered 2022-01-09: 125 mg via INTRAVENOUS
  Filled 2022-01-09: qty 2

## 2022-01-09 MED ORDER — ONDANSETRON HCL 4 MG/2ML IJ SOLN
4.0000 mg | Freq: Once | INTRAMUSCULAR | Status: AC
  Administered 2022-01-09: 4 mg via INTRAVENOUS
  Filled 2022-01-09: qty 2

## 2022-01-09 NOTE — ED Provider Notes (Signed)
[facility] EMERGENCY DEPARTMENT  Provider Note  CSN: [id number] Arrival date & time: 01/09/22 1559  History Chief Complaint  Patient presents with   Emesis   Rash    Patrick Glass is a 31 y.o. male with history of HIV well controlled on medications, last CD4 count and viral load looked good at last ID visit in Sept, missed his Jan appointment. He reports 3 days of persistent vomiting, feels hungry with gnawing abdominal pain but then when he tries to eat bread/water he vomits and has diarrhea. No fevers. No blood in emesis or stool. He reports today he tried to eat a banana and again vomited but then a short time later developed an itchy rash on his arms/legs and trunk. No SOB or trouble swallowing.    Home Medications Prior to Admission medications   Medication Sig Start Date End Date Taking? Authorizing Provider  ondansetron (ZOFRAN-ODT) 4 MG disintegrating tablet Take 1 tablet (4 mg total) by mouth every 8 (eight) hours as needed for nausea or vomiting. 01/09/22  Yes Pollyann Savoy, MD  predniSONE (STERAPRED UNI-PAK 21 TAB) 10 MG (21) TBPK tablet 10mg  Tabs, 6 day taper. Use as directed 01/09/22  Yes 03/09/22, MD  Dolutegravir-lamiVUDine (DOVATO) 50-300 MG TABS Take 1 tablet by mouth daily. 07/23/21   07/25/21, FNP  ibuprofen (ADVIL) 800 MG tablet Take 1 tablet (800 mg total) by mouth 3 (three) times daily. 10/12/21   Smoot, 13/7/22, PA-C  methocarbamol (ROBAXIN) 500 MG tablet Take 1 tablet (500 mg total) by mouth 2 (two) times daily. 10/12/21   Smoot, 13/7/22, PA-C     Allergies    Patient has no known allergies.   Review of Systems   Review of Systems Please see HPI for pertinent positives and negatives  Physical Exam BP 114/72    Pulse 90    Temp 97.6 F (36.4 C) (Oral)    Resp 18    Ht 5\' 7"  (1.702 m)    Wt 56.7 kg    SpO2 99%    BMI 19.58 kg/m   Physical Exam Vitals and nursing note reviewed.  Constitutional:      Appearance:  Normal appearance.  HENT:     Head: Normocephalic and atraumatic.     Nose: Nose normal.     Mouth/Throat:     Mouth: Mucous membranes are dry.  Eyes:     Extraocular Movements: Extraocular movements intact.     Conjunctiva/sclera: Conjunctivae normal.  Cardiovascular:     Rate and Rhythm: Normal rate.  Pulmonary:     Effort: Pulmonary effort is normal.     Breath sounds: Normal breath sounds. No stridor.  Abdominal:     General: Abdomen is flat. There is no distension.     Palpations: Abdomen is soft.     Tenderness: There is no abdominal tenderness. There is no guarding.  Musculoskeletal:        General: No swelling. Normal range of motion.     Cervical back: Neck supple.  Skin:    General: Skin is warm and dry.     Findings: Rash (urticaria on arms and legs) present.  Neurological:     General: No focal deficit present.     Mental Status: He is alert.  Psychiatric:        Mood and Affect: Mood normal.    ED Results / Procedures / Treatments   EKG None  Procedures Procedures  Medications  Ordered in the ED Medications  ondansetron (ZOFRAN) injection 4 mg (4 mg Intravenous Given 01/09/22 1645)  lactated ringers bolus 1,000 mL (0 mLs Intravenous Stopped 01/09/22 1742)  diphenhydrAMINE (BENADRYL) injection 25 mg (25 mg Intravenous Given 01/09/22 1645)  methylPREDNISolone sodium succinate (SOLU-MEDROL) 125 mg/2 mL injection 125 mg (125 mg Intravenous Given 01/09/22 1645)    Initial Impression and Plan  Patient with two problems, one is the persistent vomiting and diarrhea for three days with minimal PO intake. Will check labs, give Zofran, IVF and reassess for symptom improvement. The second his his allergic reaction, possible to banana he ate just before. No signs of airway involvement. Will given benadryl and solumedrol and reassess for improvement.   ED Course   Clinical Course as of 01/09/22 1912  Sat Jan 09, 2022  1709 CBC is normal.  [CS]  1728 CMP is normal.  [CS]   1904 UA is normal.  [CS]  1907 Patient reports he is feeling better. Tolerating PO fluids and applesauce, no longer itching and would like to go home. Given improvement in symptoms, admission is not indicated at this time. Rx for Zofran for nausea, Pred-pak for urticaria. Recommend ID follow up.  [CS]    Clinical Course User Index [CS] Pollyann Savoy, MD     MDM Rules/Calculators/A&P Medical Decision Making Problems Addressed: Nausea vomiting and diarrhea: acute illness or injury that poses a threat to life or bodily functions Urticaria: acute illness or injury with systemic symptoms  Amount and/or Complexity of Data Reviewed Labs: ordered. Decision-making details documented in ED Course.  Risk Prescription drug management. Decision regarding hospitalization.    Final Clinical Impression(s) / ED Diagnoses Final diagnoses:  Nausea vomiting and diarrhea  Urticaria    Rx / DC Orders ED Discharge Orders          Ordered    predniSONE (STERAPRED UNI-PAK 21 TAB) 10 MG (21) TBPK tablet        01/09/22 1911    ondansetron (ZOFRAN-ODT) 4 MG disintegrating tablet  Every 8 hours PRN        01/09/22 1911             Pollyann Savoy, MD 01/09/22 1912

## 2022-01-09 NOTE — ED Triage Notes (Signed)
Patient c/o abdominal pain and nausea x 3 days. Attempting to do just bread and water to keep food down. Today attempted to eat a banana and feels like symptoms are worse. Reports he threw up then laid down. When he woke up from nap he had a rash on bilateral arms and trunk of body.

## 2022-08-16 ENCOUNTER — Ambulatory Visit: Payer: Self-pay | Admitting: Family

## 2022-08-18 NOTE — Progress Notes (Signed)
Brief Narrative   Patient ID: Patrick Glass, male    DOB: 07/07/1991, 31 y.o.   MRN: [id number]  Patrick Glass is a 31 y/o AA male with HIV disease diagnosed in April 2014 with risk factor of MSM. Genotype with K103N resistance at baseline. Initial viral load of 63,103 and CD4 count of 320. Entered care in [facility] Stage 2. [id number] negative. No history of opportunistic infection. Previous ART experience with Stribild, Darrin Luis, and now Dovato.  Subjective:    Chief Complaint  Patient presents with   Follow-up    HPI:  Patrick Glass is a 31 y.o. male with HIV disease last seen on 08/17/2021 with good adherence and tolerance to Dovato.  Viral load was undetectable with CD4 count of 727.  Here today for follow-up.   Patrick Glass has been taking medication as prescribed with new nausea/vomiting that occurs when taking medication starting about 2 months ago and has tried taking medication with food.  Missed approximately 1 month of medication since his last office visit.  Financial assistance expired in April.  Overall feeling well with the concerns for his medication.  Continues to use marijuana daily to help with his appetite. Denies fevers, chills, night sweats, headaches, changes in vision, neck pain/stiffness, diarrhea, vomiting, lesions or rashes.  Condoms offered.  Vaccinations declined. Working full time.    No Known Allergies    Outpatient Medications Prior to Visit  Medication Sig Dispense Refill   Dolutegravir-lamiVUDine (DOVATO) 50-300 MG TABS Take 1 tablet by mouth daily. 30 tablet 4   ibuprofen (ADVIL) 800 MG tablet Take 1 tablet (800 mg total) by mouth 3 (three) times daily. (Patient not taking: Reported on 08/19/2022) 21 tablet 0   methocarbamol (ROBAXIN) 500 MG tablet Take 1 tablet (500 mg total) by mouth 2 (two) times daily. (Patient not taking: Reported on 08/19/2022) 20 tablet 0   ondansetron (ZOFRAN-ODT) 4 MG disintegrating tablet Take 1 tablet (4 mg total) by mouth every 8  (eight) hours as needed for nausea or vomiting. (Patient not taking: Reported on 08/19/2022) 20 tablet 0   predniSONE (STERAPRED UNI-PAK 21 TAB) 10 MG (21) TBPK tablet 10mg  Tabs, 6 day taper. Use as directed (Patient not taking: Reported on 08/19/2022) 1 each 0   No facility-administered medications prior to visit.     Past Medical History:  Diagnosis Date   HIV (human immunodeficiency virus infection) (HCC)      Past Surgical History:  Procedure Laterality Date   FINGER SURGERY        Review of Systems  Constitutional:  Negative for appetite change, chills, fatigue, fever and unexpected weight change.  Eyes:  Negative for visual disturbance.  Respiratory:  Negative for cough, chest tightness, shortness of breath and wheezing.   Cardiovascular:  Negative for chest pain and leg swelling.  Gastrointestinal:  Negative for abdominal pain, constipation, diarrhea, nausea and vomiting.  Genitourinary:  Negative for dysuria, flank pain, frequency, genital sores, hematuria and urgency.  Skin:  Negative for rash.  Allergic/Immunologic: Negative for immunocompromised state.  Neurological:  Negative for dizziness and headaches.      Objective:    BP 127/79   Pulse 78   Temp 97.8 F (36.6 C) (Oral)   Ht 5\' 7"  (1.702 m)   Wt 124 lb (56.2 kg)   BMI 19.42 kg/m  Nursing note and vital signs reviewed.  Physical Exam Constitutional:      General: He is not in acute distress.    Appearance:  He is well-developed.  Eyes:     Conjunctiva/sclera: Conjunctivae normal.  Cardiovascular:     Rate and Rhythm: Normal rate and regular rhythm.     Heart sounds: Normal heart sounds. No murmur heard.    No friction rub. No gallop.  Pulmonary:     Effort: Pulmonary effort is normal. No respiratory distress.     Breath sounds: Normal breath sounds. No wheezing or rales.  Chest:     Chest wall: No tenderness.  Abdominal:     General: Bowel sounds are normal.     Palpations: Abdomen is soft.      Tenderness: There is no abdominal tenderness.  Musculoskeletal:     Cervical back: Neck supple.  Lymphadenopathy:     Cervical: No cervical adenopathy.  Skin:    General: Skin is warm and dry.     Findings: No rash.  Neurological:     Mental Status: He is alert and oriented to person, place, and time.  Psychiatric:        Behavior: Behavior normal.        Thought Content: Thought content normal.        Judgment: Judgment normal.         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 [place] has questionably well-controlled virus reports good adherence with poor tolerance to Dovato having developed nausea and vomiting over the past 1 to 2 months.  Question if marijuana usage has contributed to this. has missed at least 1 month of medication and suspect likely more given the expiration of his financial assistance back in April.  Reviewed previous lab work and discussed plan of care.  Change medication to [place].  Samples of medication provided and recorded in pharmacy log.  Renew financial assistance.  Check lab work today including genotype.  Plan for follow-up in 1 month or sooner if needed with lab work on the same day.      Relevant Medications   bictegravir-emtricitabine-tenofovir AF (BIKTARVY) 50-200-25 MG  TABS tablet   Other Relevant Orders   HIV RNA, RTPCR W/R GT (RTI, PI,INT)   T-helper cell (CD4)- ([facility] clinic only)   Comprehensive metabolic panel   CBC   Health care maintenance    Discussed importance of safe sexual practices and condom use.  Condoms and STD testing offered. Declines vaccinations.      Late latent syphilis    Previous treatment at 1: 32 and appears to be serofast at 1: 4.  Recheck RPR today.      Relevant Medications   bictegravir-emtricitabine-tenofovir AF (BIKTARVY) 50-200-25 MG TABS tablet   Other Visit Diagnoses  Screening for STDs (sexually transmitted diseases)       Relevant Orders   Urine cytology ancillary only   Cytology (oral, anal, urethral) ancillary only   Cytology (oral, anal, urethral) ancillary only   RPR   Therapeutic drug monitoring       Relevant Orders   Lipid panel        I have discontinued Elray Q. Vrba's Dovato, methocarbamol, ibuprofen, predniSONE, and ondansetron. I am also having him start on Biktarvy.   Meds ordered this encounter  Medications   bictegravir-emtricitabine-tenofovir AF (BIKTARVY) 50-200-25 MG TABS tablet    Sig: Take 1 tablet by mouth daily.    Dispense:  30 tablet    Refill:  3    Start date 08/26/22. Will have UMAP    Order Specific Question:   Supervising Provider    Answer:   Judyann Munson [[id number]]     Follow-up: Return in about 1 month (around 09/18/2022), or if symptoms worsen or fail to improve.   Marcos Eke, MSN, FNP-C Nurse Practitioner [facility] Main number: [phone]

## 2022-08-19 ENCOUNTER — Ambulatory Visit (INDEPENDENT_AMBULATORY_CARE_PROVIDER_SITE_OTHER): Payer: Self-pay | Admitting: Family

## 2022-08-19 ENCOUNTER — Other Ambulatory Visit: Payer: Self-pay | Admitting: Pharmacist

## 2022-08-19 ENCOUNTER — Ambulatory Visit: Payer: Self-pay

## 2022-08-19 ENCOUNTER — Other Ambulatory Visit: Payer: Self-pay

## 2022-08-19 ENCOUNTER — Encounter: Payer: Self-pay | Admitting: Family

## 2022-08-19 VITALS — BP 127/79 | HR 78 | Temp 97.8°F | Ht 67.0 in | Wt 124.0 lb

## 2022-08-19 DIAGNOSIS — Z0000 Encounter for general adult medical examination without abnormal findings: Secondary | ICD-10-CM

## 2022-08-19 DIAGNOSIS — B20 Human immunodeficiency virus [HIV] disease: Secondary | ICD-10-CM

## 2022-08-19 DIAGNOSIS — A528 Late syphilis, latent: Secondary | ICD-10-CM

## 2022-08-19 DIAGNOSIS — Z113 Encounter for screening for infections with a predominantly sexual mode of transmission: Secondary | ICD-10-CM

## 2022-08-19 DIAGNOSIS — Z5181 Encounter for therapeutic drug level monitoring: Secondary | ICD-10-CM

## 2022-08-19 MED ORDER — BIKTARVY 50-200-25 MG PO TABS: 1.0000 | ORAL_TABLET | Freq: Every day | ORAL | 0 refills | Status: AC

## 2022-08-19 MED ORDER — BIKTARVY 50-200-25 MG PO TABS: 1.0000 | ORAL_TABLET | Freq: Every day | ORAL | 3 refills | Status: DC

## 2022-08-19 NOTE — Assessment & Plan Note (Addendum)
   Discussed importance of safe sexual practices and condom use.  Condoms and STD testing offered.  Declines vaccinations.  Dental clinic contact info provided in AVS to schedule appointment.

## 2022-08-19 NOTE — Assessment & Plan Note (Addendum)
Patrick Glass has questionably well-controlled virus reports good adherence with poor tolerance to Dovato having developed nausea and vomiting over the past 1 to 2 months.  Question if marijuana usage has contributed to this. has missed at least 1 month of medication and suspect likely more given the expiration of his financial assistance back in April.  Reviewed previous lab work and discussed plan of care.  Change medication to Biktarvy.  Samples of medication provided and recorded in pharmacy log.  Renew financial assistance.  Check lab work today including genotype.  Plan for follow-up in 1 month or sooner if needed with lab work on the same day.

## 2022-08-19 NOTE — Assessment & Plan Note (Signed)
Previous treatment at 1: 32 and appears to be serofast at 1: 4.  Recheck RPR today.

## 2022-08-19 NOTE — Patient Instructions (Addendum)
Nice to see you.  We will check your lab work today.  Continue to take your medication daily as prescribed.  Refills have been sent to the pharmacy.'  Please call [facility] ([facility] to schedule/follow up on your dental care at [phone]  Plan for follow up in 1 months or sooner if needed with lab work on the same day.  Have a great day and stay safe!  

## 2022-08-19 NOTE — Progress Notes (Signed)
Medication Samples have been provided to the patient.  Drug name: Biktarvy        Strength: 50/200/25 mg       Qty: 21 tablets (3 bottles)   LOT: [id number]   Exp.Date: 08/2024  Dosing instructions: Take one tablet by mouth once daily  The patient has been instructed regarding the correct time, dose, and frequency of taking this medication, including desired effects and most common side effects.   Latrina Guttman L. Tyashia Morrisette, PharmD, BCIDP, AAHIVP, CPP Clinical Pharmacist Practitioner Infectious Diseases Clinical Pharmacist [facility] 11/17/2020, 10:07 AM  

## 2022-08-20 LAB — CYTOLOGY, (ORAL, ANAL, URETHRAL) ANCILLARY ONLY
Chlamydia: NEGATIVE
Chlamydia: NEGATIVE
Comment: NEGATIVE
Comment: NEGATIVE
Comment: NORMAL
Comment: NORMAL
Neisseria Gonorrhea: NEGATIVE
Neisseria Gonorrhea: NEGATIVE

## 2022-08-20 LAB — URINE CYTOLOGY ANCILLARY ONLY
Chlamydia: NEGATIVE
Comment: NEGATIVE
Comment: NORMAL
Neisseria Gonorrhea: NEGATIVE

## 2022-08-20 LAB — T-HELPER CELL (CD4) - (RCID CLINIC ONLY)
CD4 % Helper T Cell: 46 % (ref 33–65)
CD4 T Cell Abs: 780 /uL (ref 400–1790)

## 2022-08-28 LAB — COMPREHENSIVE METABOLIC PANEL
AG Ratio: 1.6 (calc) (ref 1.0–2.5)
ALT: 7 U/L — ABNORMAL LOW (ref 9–46)
AST: 15 U/L (ref 10–40)
Albumin: 4.4 g/dL (ref 3.6–5.1)
Alkaline phosphatase (APISO): 48 U/L (ref 36–130)
BUN: 12 mg/dL (ref 7–25)
CO2: 32 mmol/L (ref 20–32)
Calcium: 9.9 mg/dL (ref 8.6–10.3)
Chloride: 104 mmol/L (ref 98–110)
Creat: 0.93 mg/dL (ref 0.60–1.26)
Globulin: 2.8 g/dL (calc) (ref 1.9–3.7)
Glucose, Bld: 74 mg/dL (ref 65–99)
Potassium: 4.1 mmol/L (ref 3.5–5.3)
Sodium: 140 mmol/L (ref 135–146)
Total Bilirubin: 0.3 mg/dL (ref 0.2–1.2)
Total Protein: 7.2 g/dL (ref 6.1–8.1)

## 2022-08-28 LAB — HIV RNA, RTPCR W/R GT (RTI, PI,INT)
HIV 1 RNA Quant: 2630 copies/mL — ABNORMAL HIGH
HIV-1 RNA Quant, Log: 3.42 Log copies/mL — ABNORMAL HIGH

## 2022-08-28 LAB — CBC
HCT: 39.9 % (ref 38.5–50.0)
Hemoglobin: 13.7 g/dL (ref 13.2–17.1)
MCH: 32 pg (ref 27.0–33.0)
MCHC: 34.3 g/dL (ref 32.0–36.0)
MCV: 93.2 fL (ref 80.0–100.0)
MPV: 9.9 fL (ref 7.5–12.5)
Platelets: 256 10*3/uL (ref 140–400)
RBC: 4.28 10*6/uL (ref 4.20–5.80)
RDW: 12.8 % (ref 11.0–15.0)
WBC: 2.9 10*3/uL — ABNORMAL LOW (ref 3.8–10.8)

## 2022-08-28 LAB — HIV-1 GENOTYPE: HIV-1 Genotype: DETECTED — AB

## 2022-08-28 LAB — FLUORESCENT TREPONEMAL AB(FTA)-IGG-BLD: Fluorescent Treponemal ABS: REACTIVE — AB

## 2022-08-28 LAB — LIPID PANEL
Cholesterol: 179 mg/dL (ref <200)
HDL: 42 mg/dL (ref >=40)
LDL Cholesterol (Calc): 110 mg/dL (calc) — ABNORMAL HIGH
Non-HDL Cholesterol (Calc): 137 mg/dL (calc) — ABNORMAL HIGH (ref <130)
Total CHOL/HDL Ratio: 4.3 (calc) (ref <5.0)
Triglycerides: 156 mg/dL — ABNORMAL HIGH (ref <150)

## 2022-08-28 LAB — HIV-1 INTEGRASE GENOTYPE

## 2022-08-28 LAB — RPR TITER: RPR Titer: 1:2 {titer} — ABNORMAL HIGH

## 2022-08-28 LAB — RPR: RPR Ser Ql: REACTIVE — AB

## 2022-09-13 ENCOUNTER — Ambulatory Visit: Payer: Medicaid Other | Admitting: Family

## 2022-12-02 ENCOUNTER — Telehealth: Payer: Self-pay

## 2022-12-02 NOTE — Telephone Encounter (Signed)
LVM. AS, CMA 

## 2023-03-29 ENCOUNTER — Other Ambulatory Visit (HOSPITAL_COMMUNITY)
Admission: RE | Admit: 2023-03-29 | Discharge: 2023-03-29 | Disposition: A | Discharge status: Home / Self Care | Payer: Medicaid Other | Source: Ambulatory Visit | Attending: Family | Admitting: Family

## 2023-03-29 ENCOUNTER — Encounter: Payer: Self-pay | Admitting: Family

## 2023-03-29 ENCOUNTER — Ambulatory Visit (INDEPENDENT_AMBULATORY_CARE_PROVIDER_SITE_OTHER): Payer: Medicaid Other | Admitting: Family

## 2023-03-29 ENCOUNTER — Other Ambulatory Visit: Payer: Self-pay

## 2023-03-29 VITALS — BP 135/87 | HR 88 | Temp 98.7°F | Resp 16 | Ht 67.0 in | Wt 115.2 lb

## 2023-03-29 DIAGNOSIS — Z113 Encounter for screening for infections with a predominantly sexual mode of transmission: Secondary | ICD-10-CM

## 2023-03-29 DIAGNOSIS — B20 Human immunodeficiency virus [HIV] disease: Secondary | ICD-10-CM

## 2023-03-29 DIAGNOSIS — Z720 Tobacco use: Secondary | ICD-10-CM | POA: Diagnosis not present

## 2023-03-29 DIAGNOSIS — R21 Rash and other nonspecific skin eruption: Secondary | ICD-10-CM

## 2023-03-29 MED ORDER — BIKTARVY 50-200-25 MG PO TABS: 1.0000 | ORAL_TABLET | Freq: Every day | ORAL | 5 refills | Status: DC

## 2023-03-29 MED ORDER — TRIAMCINOLONE ACETONIDE 0.1 % EX CREA: 1.0000 | TOPICAL_CREAM | Freq: Two times a day (BID) | CUTANEOUS | 0 refills | Status: AC

## 2023-03-29 NOTE — Patient Instructions (Addendum)
Nice to see you.  We will check your lab work today.  Continue to take your medication daily as prescribed.  Refills have been sent to the pharmacy.  Plan for follow up in 1 months or sooner if needed with lab work on the same day.  Have a great day and stay safe!  

## 2023-03-29 NOTE — Progress Notes (Unsigned)
    Brief Narrative   Patient ID: Patrick Glass, male    DOB: [birth year], 32 y.o.   MRN: [id number]  Mr. Glass is a 32 y/o AA male with HIV disease diagnosed in April 2014 with risk factor of MSM. Genotype with K103N resistance at baseline. Initial viral load of 63,103 and CD4 count of 320. Entered care in CDC Stage 2. [id number] negative. No history of opportunistic infection. Previous ART experience with Stribild, Darrin Luis, and now Dovato.   Subjective:    Chief Complaint  Patient presents with   Follow-up    B20 - patient reports rash itchy rash on neck and abdomen x 2 months.     HPI:  Patrick Glass is a 32 y.o. male with HIV disease last seen on 08/19/22 with poorly controlled virus and good tolerance to Dovato. Viral load was 2,630 with CD4 count of 780. RPR titer down to 1:2 from previous treatment level of 1:16. Other STD testing negative. Renal function, hepatic function and electrolytes within normal ranges. Here today for follow up.   Ran out of medication of 1 month ago. Rash located on sides, neck and stomach going on for about 2 months described as itchy; that are aggrevated by sweat.  Tetatnus and menveo the No Known Allergies    Outpatient Medications Prior to Visit  Medication Sig Dispense Refill   bictegravir-emtricitabine-tenofovir AF (BIKTARVY) 50-200-25 MG TABS tablet Take 1 tablet by mouth daily. 30 tablet 3   No facility-administered medications prior to visit.     Past Medical History:  Diagnosis Date   HIV (human immunodeficiency virus infection)      Past Surgical History:  Procedure Laterality Date   FINGER SURGERY        Review of Systems    Objective:    BP 135/87   Pulse 88   Temp 98.7 F (37.1 C) (Oral)   Resp 16   Ht  (1.702 m)   Wt 115 lb 3.2 oz (52.3 kg)   SpO2 100%   BMI 18.04 kg/m  Nursing note and vital signs reviewed.  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   No orders of the defined types were placed in this encounter.    Follow-up: No follow-ups on file.   Marcos Eke, MSN, FNP-C Nurse Practitioner [facility] Main number: [phone]

## 2023-03-30 ENCOUNTER — Encounter: Payer: Self-pay | Admitting: Family

## 2023-03-30 DIAGNOSIS — R21 Rash and other nonspecific skin eruption: Secondary | ICD-10-CM | POA: Insufficient documentation

## 2023-03-30 LAB — CYTOLOGY, (ORAL, ANAL, URETHRAL) ANCILLARY ONLY
Chlamydia: NEGATIVE
Chlamydia: NEGATIVE
Comment: NEGATIVE
Comment: NEGATIVE
Comment: NORMAL
Comment: NORMAL
Neisseria Gonorrhea: NEGATIVE
Neisseria Gonorrhea: NEGATIVE

## 2023-03-30 LAB — URINE CYTOLOGY ANCILLARY ONLY
Chlamydia: NEGATIVE
Comment: NEGATIVE
Comment: NORMAL
Neisseria Gonorrhea: NEGATIVE

## 2023-03-30 LAB — T-HELPER CELL (CD4) - (RCID CLINIC ONLY)
CD4 % Helper T Cell: 43 % (ref 33–65)
CD4 T Cell Abs: 476 /uL (ref 400–1790)

## 2023-03-30 NOTE — Assessment & Plan Note (Signed)
Patrick Glass continues to use tobacco daily. Discussed importance of working towards cessation to reduce risk of cardiovascular, respiratory, and malignant disease in the future. In the pre-contemplation stage of change and not ready to quit.

## 2023-03-30 NOTE — Assessment & Plan Note (Addendum)
Patrick Glass continues to have poorly controlled virus secondary to less than optimal adherence to his medication regimen. Has been off medication for 1 month but last refill has not been since September of 2023. Discussed goal of U=U and need for improvement in adherence to reduce risk of disease progression or develop complications in the future. Check lab work. Continue current dose of Biktarvy. Plan for follow up in 1 month or sooner if needed.

## 2023-03-30 NOTE — Assessment & Plan Note (Signed)
Patrick Glass has a rash that appears to be consistent with eczematic or possible psoriatic origin. Will treat with triamcinolone cream and advised to continue with moisturizing with Dove, Aveeno or Eucerin products. May require referral to Dermatology if does not improve.

## 2023-04-02 LAB — HIV RNA, RTPCR W/R GT (RTI, PI,INT)
HIV 1 RNA Quant: 79 copies/mL — ABNORMAL HIGH
HIV-1 RNA Quant, Log: 1.9 Log copies/mL — ABNORMAL HIGH

## 2023-04-02 LAB — RPR: RPR Ser Ql: REACTIVE — AB

## 2023-04-02 LAB — T PALLIDUM AB: T Pallidum Abs: POSITIVE — AB

## 2023-04-02 LAB — RPR TITER: RPR Titer: 1:2 {titer} — ABNORMAL HIGH

## 2023-04-04 ENCOUNTER — Telehealth: Payer: Self-pay

## 2023-04-04 NOTE — Progress Notes (Signed)
Patient aware of results and voiced his understanding.

## 2023-04-04 NOTE — Telephone Encounter (Signed)
Patient aware of results and voiced his understanding. ° °Derika Eckles P Tabetha Haraway, CMA ° °

## 2023-04-04 NOTE — Telephone Encounter (Signed)
-----   Message from Veryl Speak, FNP sent at 04/04/2023 11:51 AM EDT ----- Please inform Patrick Glass that his lab work looks okay with viral load improved to 79 and CD4 count 476. STD testing was negative. Continue to take medications as prescribed. Thanks!

## 2023-04-28 ENCOUNTER — Ambulatory Visit: Payer: Medicaid Other | Admitting: Family

## 2023-04-28 ENCOUNTER — Telehealth: Payer: Self-pay

## 2023-04-28 NOTE — Telephone Encounter (Signed)
Attempted to call patient regarding missed appointment this morning. Not able to reach him at this time. Call cannot be completed at this time.  Juanita Laster, RMA

## 2024-01-19 ENCOUNTER — Other Ambulatory Visit: Payer: Self-pay

## 2024-01-19 ENCOUNTER — Other Ambulatory Visit (HOSPITAL_COMMUNITY)
Admission: RE | Admit: 2024-01-19 | Discharge: 2024-01-19 | Disposition: A | Discharge status: Home / Self Care | Payer: 59 | Source: Ambulatory Visit | Attending: Internal Medicine | Admitting: Internal Medicine

## 2024-01-19 ENCOUNTER — Encounter: Payer: Self-pay | Admitting: Internal Medicine

## 2024-01-19 ENCOUNTER — Ambulatory Visit (INDEPENDENT_AMBULATORY_CARE_PROVIDER_SITE_OTHER): Payer: 59 | Admitting: Internal Medicine

## 2024-01-19 VITALS — BP 126/88 | HR 88 | Temp 98.0°F | Ht 67.0 in | Wt 117.0 lb

## 2024-01-19 DIAGNOSIS — Z79899 Other long term (current) drug therapy: Secondary | ICD-10-CM | POA: Diagnosis not present

## 2024-01-19 DIAGNOSIS — Z23 Encounter for immunization: Secondary | ICD-10-CM | POA: Insufficient documentation

## 2024-01-19 DIAGNOSIS — B20 Human immunodeficiency virus [HIV] disease: Secondary | ICD-10-CM | POA: Diagnosis not present

## 2024-01-19 DIAGNOSIS — Z720 Tobacco use: Secondary | ICD-10-CM

## 2024-01-19 DIAGNOSIS — Z113 Encounter for screening for infections with a predominantly sexual mode of transmission: Secondary | ICD-10-CM | POA: Diagnosis not present

## 2024-01-19 MED ORDER — BIKTARVY 50-200-25 MG PO TABS: 1.0000 | ORAL_TABLET | Freq: Every day | ORAL | 11 refills | Status: DC

## 2024-01-19 NOTE — Assessment & Plan Note (Signed)
Flu shot discussed and given today.  He will come back for a COVID-vaccine

## 2024-01-19 NOTE — Assessment & Plan Note (Signed)
He seems to be doing well though unfortunately had been out of medicine for 2 days.  I do anticipate some viral load activity but have refilled his medications.  Will have him come back in about 3 to 4 months since he had been out even just a brief time to double check.

## 2024-01-19 NOTE — Progress Notes (Signed)
   Subjective:    Patient ID: Patrick Glass, male    DOB: December 14, 1990, 33 y.o.   MRN: [id number]  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 Genotype with K103N resistance at baseline. Initial viral load of 63,103 and CD4 count of 320. Entered care in CDC Stage 2. [id number] negative. No history of opportunistic infection. Previous ART experience with Stribild, Darrin Luis, and now Dovato.   HPI Patrick Glass here for follow-up of [name] Continues on Biktarvy and denies any missed doses.  He has sporadic follow-up and was last seen in April of last year had a nearly suppressed virus after being out of care.  He has been doing well though on Biktarvy but unfortunately ran out 2 days ago.  He has no complaints otherwise including no weight loss, diarrhea or rash.   Review of Systems  Constitutional:  Negative for fatigue.  Gastrointestinal:  Negative for diarrhea and nausea.  Skin:  Negative for rash.       Objective:   Physical Exam Eyes:     General: No scleral icterus. Pulmonary:     Effort: Pulmonary effort is normal.  Neurological:     Mental Status: He is alert.   SH: + occasional tobacco        Assessment & Plan:

## 2024-01-19 NOTE — Assessment & Plan Note (Signed)
He is down to being an occasional smoker and I discussed with him the importance of complete cessation.  He is contemplative of this.

## 2024-01-19 NOTE — Assessment & Plan Note (Signed)
Will screen today

## 2024-01-20 LAB — CYTOLOGY, (ORAL, ANAL, URETHRAL) ANCILLARY ONLY
Chlamydia: NEGATIVE
Chlamydia: NEGATIVE
Comment: NEGATIVE
Comment: NEGATIVE
Comment: NORMAL
Comment: NORMAL
Neisseria Gonorrhea: NEGATIVE
Neisseria Gonorrhea: NEGATIVE

## 2024-01-20 LAB — URINE CYTOLOGY ANCILLARY ONLY
Chlamydia: NEGATIVE
Comment: NEGATIVE
Comment: NORMAL
Neisseria Gonorrhea: NEGATIVE

## 2024-01-20 LAB — T-HELPER CELL (CD4) - (RCID CLINIC ONLY)
CD4 % Helper T Cell: 38 % (ref 33–65)
CD4 T Cell Abs: 536 /uL (ref 400–1790)

## 2024-01-23 LAB — COMPLETE METABOLIC PANEL WITH GFR
AG Ratio: 1.6 (calc) (ref 1.0–2.5)
ALT: 9 U/L (ref 9–46)
AST: 16 U/L (ref 10–40)
Albumin: 4.5 g/dL (ref 3.6–5.1)
Alkaline phosphatase (APISO): 65 U/L (ref 36–130)
BUN: 16 mg/dL (ref 7–25)
CO2: 22 mmol/L (ref 20–32)
Calcium: 9.6 mg/dL (ref 8.6–10.3)
Chloride: 106 mmol/L (ref 98–110)
Creat: 1.08 mg/dL (ref 0.60–1.26)
Globulin: 2.9 g/dL (ref 1.9–3.7)
Glucose, Bld: 72 mg/dL (ref 65–99)
Potassium: 4.2 mmol/L (ref 3.5–5.3)
Sodium: 141 mmol/L (ref 135–146)
Total Bilirubin: 0.2 mg/dL (ref 0.2–1.2)
Total Protein: 7.4 g/dL (ref 6.1–8.1)
eGFR: 94 mL/min/{1.73_m2} (ref >=60)

## 2024-01-23 LAB — CBC WITH DIFFERENTIAL/PLATELET
Absolute Lymphocytes: 1710 {cells}/uL (ref 850–3900)
Absolute Monocytes: 248 {cells}/uL (ref 200–950)
Basophils Absolute: 10 {cells}/uL (ref 0–200)
Basophils Relative: 0.3 %
Eosinophils Absolute: 258 {cells}/uL (ref 15–500)
Eosinophils Relative: 7.6 %
HCT: 39.7 % (ref 38.5–50.0)
Hemoglobin: 13.2 g/dL (ref 13.2–17.1)
MCH: 31.1 pg (ref 27.0–33.0)
MCHC: 33.2 g/dL (ref 32.0–36.0)
MCV: 93.6 fL (ref 80.0–100.0)
MPV: 9.8 fL (ref 7.5–12.5)
Monocytes Relative: 7.3 %
Neutro Abs: 1173 {cells}/uL — ABNORMAL LOW (ref 1500–7800)
Neutrophils Relative %: 34.5 %
Platelets: 258 10*3/uL (ref 140–400)
RBC: 4.24 10*6/uL (ref 4.20–5.80)
RDW: 12.3 % (ref 11.0–15.0)
Total Lymphocyte: 50.3 %
WBC: 3.4 10*3/uL — ABNORMAL LOW (ref 3.8–10.8)

## 2024-01-23 LAB — T PALLIDUM AB: T Pallidum Abs: POSITIVE — AB

## 2024-01-23 LAB — LIPID PANEL
Cholesterol: 206 mg/dL — ABNORMAL HIGH (ref <200)
HDL: 55 mg/dL (ref >=40)
LDL Cholesterol (Calc): 122 mg/dL — ABNORMAL HIGH
Non-HDL Cholesterol (Calc): 151 mg/dL — ABNORMAL HIGH (ref <130)
Total CHOL/HDL Ratio: 3.7 (calc) (ref <5.0)
Triglycerides: 170 mg/dL — ABNORMAL HIGH (ref <150)

## 2024-01-23 LAB — RPR TITER: RPR Titer: 1:2 {titer} — ABNORMAL HIGH

## 2024-01-23 LAB — HIV-1 RNA QUANT-NO REFLEX-BLD
HIV 1 RNA Quant: NOT DETECTED {copies}/mL
HIV-1 RNA Quant, Log: NOT DETECTED {Log_copies}/mL

## 2024-01-23 LAB — RPR: RPR Ser Ql: REACTIVE — AB

## 2024-04-12 ENCOUNTER — Ambulatory Visit: Payer: 59 | Admitting: Internal Medicine

## 2024-04-17 ENCOUNTER — Ambulatory Visit: Admitting: Internal Medicine

## 2024-04-17 ENCOUNTER — Encounter: Payer: Self-pay | Admitting: Internal Medicine

## 2024-04-17 ENCOUNTER — Other Ambulatory Visit: Payer: Self-pay

## 2024-04-17 VITALS — BP 127/71 | HR 98 | Temp 98.8°F | Wt 121.6 lb

## 2024-04-17 DIAGNOSIS — B20 Human immunodeficiency virus [HIV] disease: Secondary | ICD-10-CM | POA: Diagnosis not present

## 2024-04-17 NOTE — Progress Notes (Signed)
 [facility]     HPI: Patrick Glass is a 33 y.o. male presents for HIV management. 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 Genotype with K103N resistance at baseline. Initial viral load of 63,103 and CD4 count of 320. Entered care in CDC Stage 2. [id number] negative. No history of opportunistic infection. Previous ART experience with Stribild, Genvoya , and now Dovato .   today: no misse doses. Pt would like sti testing. Last sexually about 1 year afo.  Occupation: Music therapist, Conservation officer, nature Housing: house with partner Support: family  Understanding of HIV: Etoh/drug/tobacco UEA:VWUJWJ/ no/no  Past Medical History:  Diagnosis Date   HIV (human immunodeficiency virus infection) (HCC)     Past Surgical History:  Procedure Laterality Date   FINGER SURGERY      No family history on file. Current Outpatient Medications on File Prior to Visit  Medication Sig Dispense Refill   bictegravir-emtricitabine-tenofovir AF (BIKTARVY ) 50-200-25 MG TABS tablet Take 1 tablet by mouth daily. 30 tablet 11   triamcinolone  cream (KENALOG ) 0.1 % Apply 1 Application topically 2 (two) times daily. (Patient not taking: Reported on 04/17/2024) 30 g 0   No current facility-administered medications on file prior to visit.    No Known Allergies    Lab Results HIV 1 RNA Quant  Date Value  01/19/2024 Not Detected Copies/mL  03/29/2023 79 copies/mL (H)  08/19/2022 2,630 copies/mL (H)   CD4 T Cell Abs (/uL)  Date Value  01/19/2024 536  03/29/2023 476  08/19/2022 780   No results found for: "HIV1GENOSEQ" Lab Results  Component Value Date   WBC 3.4 (L) 01/19/2024   HGB 13.2 01/19/2024   HCT 39.7 01/19/2024   MCV 93.6 01/19/2024   PLT 258 01/19/2024    Lab Results  Component Value Date   CREATININE 1.08 01/19/2024   BUN 16 01/19/2024   NA 141 01/19/2024   K 4.2 01/19/2024   CL 106 01/19/2024   CO2 22 01/19/2024   Lab Results   Component Value Date   ALT 9 01/19/2024   AST 16 01/19/2024   ALKPHOS 61 01/09/2022   BILITOT 0.2 01/19/2024    Lab Results  Component Value Date   CHOL 206 (H) 01/19/2024   TRIG 170 (H) 01/19/2024   HDL 55 01/19/2024   LDLCALC 122 (H) 01/19/2024   Lab Results  Component Value Date   HAV REACTIVE (A) 05/05/2018   Lab Results  Component Value Date   HEPBSAG NON-REACTIVE 05/05/2018   HEPBSAB REACTIVE (A) 05/05/2018   Lab Results  Component Value Date   HCVAB NEGATIVE 03/15/2013   Lab Results  Component Value Date   CHLAMYDIAWP Negative 01/19/2024   CHLAMYDIAWP Negative 01/19/2024   CHLAMYDIAWP Negative 01/19/2024   N Negative 01/19/2024   N Negative 01/19/2024   N Negative 01/19/2024   No results found for: "GCPROBEAPT" No results found for: "QUANTGOLD"  Assessment/Plan #HIV/Aysmptomatic -CD4 536, VL ND, on 01/19/24 -Biktarvy  -hiv labs today.  -FU in 6 months  #STI testing -today   #Vaccination COVID Flu Monkeypox 08/17/21 PCV 23 07/23/21 Meningitis x2 HepA immune HEpB immune Tdap 2011 Shingles  #Health maintenance->today -RPR 1:2 on 01/19/24 -HCV -GC-3 point     Orlie Bjornstad, MD [facility] I have personally spent 42 minutes involved in face-to-face and non-face-to-face activities for this patient on the day of the visit. Professional time spent includes the  following activities: Preparing to see the patient (review of tests), Obtaining and/or reviewing separately obtained history (admission/discharge record), Performing a medically appropriate examination and/or evaluation , Ordering medications/tests/procedures, referring and communicating with other health care professionals, Documenting clinical information in the EMR, Independently interpreting results (not separately reported), Communicating results to the patient/family/caregiver, Counseling and educating the patient/family/caregiver and  Care coordination (not separately reported).

## 2024-04-18 LAB — T-HELPER CELLS (CD4) COUNT (NOT AT ARMC)
Absolute CD4: 597 {cells}/uL (ref 490–1740)
CD4 T Helper %: 45 % (ref 30–61)
Total lymphocyte count: 1336 {cells}/uL (ref 850–3900)

## 2024-04-18 LAB — GC/CHLAMYDIA PROBE, AMP (THROAT)
Chlamydia trachomatis RNA: NOT DETECTED
Neisseria gonorrhoeae RNA: NOT DETECTED

## 2024-04-18 LAB — HEPATITIS C ANTIBODY: Hepatitis C Ab: NONREACTIVE

## 2024-04-18 LAB — C. TRACHOMATIS/N. GONORRHOEAE RNA
C. trachomatis RNA, TMA: NOT DETECTED
N. gonorrhoeae RNA, TMA: NOT DETECTED

## 2024-04-18 LAB — CT/NG RNA, TMA RECTAL
Chlamydia Trachomatis RNA: NOT DETECTED
Neisseria Gonorrhoeae RNA: NOT DETECTED

## 2024-07-03 NOTE — Progress Notes (Unsigned)
   HPI: Patrick Glass is a 33 y.o. male who presents to the [facility] clinic today for STI testing.  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   TRIAMCINOLONE  CREAM (KENALOG ) 0.1 %    Apply 1 Application topically 2 (two) times daily.  Modified Medications   No medications on file  Discontinued Medications   No medications on file    Assessment: Patrick Glass is here today for STI testing. Last saw Dr. Dennise on 04/17/24 for his HIV management. He is undetectable on Biktarvy . Last STI testing was negative at that visit as well.  Today, he ***  Plan: - STI screening: HIV antibody, RPR, urine/rectal/pharyngeal GC/CT swabs for cytology today - F/u results to see if treatment is needed  Chelcee Korpi L. Asmara Backs, PharmD, BCIDP, AAHIVP, CPP Clinical Pharmacist Practitioner - Infectious Diseases Clinical Pharmacist Lead - Specialty Pharmacy [facility]

## 2024-07-04 ENCOUNTER — Other Ambulatory Visit: Payer: Self-pay

## 2024-07-04 ENCOUNTER — Other Ambulatory Visit (HOSPITAL_COMMUNITY)
Admission: RE | Admit: 2024-07-04 | Discharge: 2024-07-04 | Disposition: A | Discharge status: Home / Self Care | Source: Ambulatory Visit | Attending: Internal Medicine | Admitting: Internal Medicine

## 2024-07-04 ENCOUNTER — Ambulatory Visit (INDEPENDENT_AMBULATORY_CARE_PROVIDER_SITE_OTHER): Admitting: Pharmacist

## 2024-07-04 DIAGNOSIS — Z113 Encounter for screening for infections with a predominantly sexual mode of transmission: Secondary | ICD-10-CM | POA: Insufficient documentation

## 2024-07-04 DIAGNOSIS — B20 Human immunodeficiency virus [HIV] disease: Secondary | ICD-10-CM | POA: Diagnosis not present

## 2024-07-05 LAB — CYTOLOGY, (ORAL, ANAL, URETHRAL) ANCILLARY ONLY
Chlamydia: NEGATIVE
Chlamydia: NEGATIVE
Comment: NEGATIVE
Comment: NEGATIVE
Comment: NORMAL
Comment: NORMAL
Neisseria Gonorrhea: NEGATIVE
Neisseria Gonorrhea: NEGATIVE

## 2024-07-05 LAB — URINE CYTOLOGY ANCILLARY ONLY
Chlamydia: NEGATIVE
Comment: NEGATIVE
Comment: NORMAL
Neisseria Gonorrhea: NEGATIVE

## 2024-07-07 LAB — HIV-1 RNA QUANT-NO REFLEX-BLD
HIV 1 RNA Quant: 3330 {copies}/mL — ABNORMAL HIGH
HIV-1 RNA Quant, Log: 3.52 {Log_copies}/mL — ABNORMAL HIGH

## 2024-07-07 LAB — RPR: RPR Ser Ql: REACTIVE — AB

## 2024-07-07 LAB — RPR TITER: RPR Titer: 1:2 {titer} — ABNORMAL HIGH

## 2024-07-07 LAB — T PALLIDUM AB: T Pallidum Abs: POSITIVE — AB

## 2024-07-10 ENCOUNTER — Telehealth: Payer: Self-pay | Admitting: Pharmacist

## 2024-07-10 ENCOUNTER — Ambulatory Visit: Payer: Self-pay | Admitting: Pharmacist

## 2024-07-10 DIAGNOSIS — B20 Human immunodeficiency virus [HIV] disease: Secondary | ICD-10-CM

## 2024-07-10 NOTE — Telephone Encounter (Signed)
 Saw patient in clinic last week on 7/30 for a STI check. All STI testing was negative but HIV viral load was 3,330. I asked if he was having any issues with Biktarvy  at the appointment and he told me no, so I called him today to discuss  He states that he lost his job and did not have money for food. He does not take his Biktarvy  without eating as it hurts his stomach if he does, so he was unable to take it for about a month. He states that things are better now and he has restarted taking it this week. He is taking it every day without issues. He does not have any issues getting it from the pharmacy - it is a $0 copay. Asked if he wouldn't mind coming back in for a viral load recheck in ~4 weeks. Scheduled for 08/15/24 at 915am. He sees Dr. Dennise in November.   Astoria Condon L. Buckley Rehman, PharmD, BCIDP, AAHIVP, CPP Infectious Diseases Clinical Pharmacist Practitioner Clinical Pharmacist Lead, Specialty Pharmacy [facility]

## 2024-08-15 ENCOUNTER — Other Ambulatory Visit

## 2024-08-17 ENCOUNTER — Telehealth: Payer: Self-pay

## 2024-08-17 NOTE — Telephone Encounter (Signed)
 Detectable Viral Load Intervention (DVL)  Most recent VL:  HIV 1 RNA Quant  Date Value Ref Range Status  07/04/2024 3,330 (H) NOT DETECTED copies/mL Final  01/19/2024 Not Detected Copies/mL Final  03/29/2023 79 (H) copies/mL Final    Last Clinic Visit: 07/04/24  Current ART regimen: Biktarvy   Appointment status: patient has future appointment scheduled  Medication last dispensed (per chart review):   Medication Adherence   What pharmacy do you use for your ART? [facility]  Do you pick up your medication at the pharmacy or is it mailed to you? pick up at pharmacy  How often do you miss a dose your ART? never or almost never  Are you experiencing any side effects with your ART? No- has resolved   Are you having any trouble remembering what medication(s) you are supposed to take or how you are supposed to take them?   What helps you remember to take your medication(s)?    Barriers to Care   Denies barriers at this time. Informed pt that office  does have a food pantry and can assist with some food if he needs any assistance.   Interventions   Called patient to discuss medication adherence and possible barriers to care. Rescheduled lab appt. Denies missing doses of ART since last spoke. Working now and has resolved food insecurity.  Lorenda CHRISTELLA Code, RMA

## 2024-08-22 ENCOUNTER — Other Ambulatory Visit

## 2024-10-18 ENCOUNTER — Ambulatory Visit: Admitting: Internal Medicine

## 2024-11-14 ENCOUNTER — Other Ambulatory Visit: Payer: Self-pay

## 2024-11-14 ENCOUNTER — Telehealth: Payer: Self-pay

## 2024-11-14 MED ORDER — BIKTARVY 50-200-25 MG PO TABS: 1.0000 | ORAL_TABLET | Freq: Every day | ORAL | 0 refills | Status: AC

## 2024-11-14 NOTE — Telephone Encounter (Signed)
 Patient has been scheduled for 12/12/24 With Dr. Dennise, with same day labs. Out of medication, requesting refill for Biktarvy  be sent to [facility] on [address], once sent call patient at [phone].

## 2024-11-14 NOTE — Telephone Encounter (Signed)
 1 month supply sent to requested pharmacy. [facility] will contact patient when medication is ready.

## 2024-12-12 ENCOUNTER — Ambulatory Visit: Admitting: Internal Medicine
